# Patient Record
Sex: Male | Born: 1955 | Race: White | Hispanic: No | Smoking: Never smoker
Health system: Southern US, Community
[De-identification: ages and names within clinical notes are randomized; demographics above are authoritative.]

## PROBLEM LIST (undated history)

## (undated) DIAGNOSIS — E559 Vitamin D deficiency, unspecified: Secondary | ICD-10-CM

## (undated) DIAGNOSIS — K760 Fatty (change of) liver, not elsewhere classified: Secondary | ICD-10-CM

## (undated) DIAGNOSIS — D1720 Benign lipomatous neoplasm of skin and subcutaneous tissue of unspecified limb: Secondary | ICD-10-CM

## (undated) DIAGNOSIS — C4491 Basal cell carcinoma of skin, unspecified: Secondary | ICD-10-CM

## (undated) DIAGNOSIS — E538 Deficiency of other specified B group vitamins: Secondary | ICD-10-CM

## (undated) DIAGNOSIS — R972 Elevated prostate specific antigen [PSA]: Secondary | ICD-10-CM

## (undated) DIAGNOSIS — R011 Cardiac murmur, unspecified: Secondary | ICD-10-CM

## (undated) DIAGNOSIS — R195 Other fecal abnormalities: Secondary | ICD-10-CM

## (undated) DIAGNOSIS — D239 Other benign neoplasm of skin, unspecified: Secondary | ICD-10-CM

## (undated) DIAGNOSIS — M1990 Unspecified osteoarthritis, unspecified site: Secondary | ICD-10-CM

## (undated) DIAGNOSIS — R7989 Other specified abnormal findings of blood chemistry: Secondary | ICD-10-CM

## (undated) DIAGNOSIS — I340 Nonrheumatic mitral (valve) insufficiency: Secondary | ICD-10-CM

## (undated) DIAGNOSIS — K921 Melena: Secondary | ICD-10-CM

## (undated) DIAGNOSIS — J309 Allergic rhinitis, unspecified: Secondary | ICD-10-CM

## (undated) HISTORY — DX: Fatty (change of) liver, not elsewhere classified: K76.0

## (undated) HISTORY — DX: Melena: K92.1

## (undated) HISTORY — DX: Vitamin D deficiency, unspecified: E55.9

## (undated) HISTORY — DX: Elevated prostate specific antigen (PSA): R97.20

## (undated) HISTORY — DX: Deficiency of other specified B group vitamins: E53.8

## (undated) HISTORY — DX: Other specified abnormal findings of blood chemistry: R79.89

## (undated) HISTORY — DX: Nonrheumatic mitral (valve) insufficiency: I34.0

## (undated) HISTORY — DX: Basal cell carcinoma of skin, unspecified: C44.91

## (undated) HISTORY — DX: Benign lipomatous neoplasm of skin and subcutaneous tissue of unspecified limb: D17.20

## (undated) HISTORY — DX: Other fecal abnormalities: R19.5

## (undated) HISTORY — DX: Unspecified osteoarthritis, unspecified site: M19.90

## (undated) HISTORY — DX: Allergic rhinitis, unspecified: J30.9

## (undated) HISTORY — DX: Cardiac murmur, unspecified: R01.1

## (undated) HISTORY — DX: Other benign neoplasm of skin, unspecified: D23.9

---

## 2003-08-03 ENCOUNTER — Ambulatory Visit (HOSPITAL_BASED_OUTPATIENT_CLINIC_OR_DEPARTMENT_OTHER): Admission: RE | Admit: 2003-08-03 | Discharge: 2003-08-03 | Payer: Self-pay | Admitting: Plastic Surgery

## 2003-08-03 ENCOUNTER — Encounter (INDEPENDENT_AMBULATORY_CARE_PROVIDER_SITE_OTHER): Payer: Self-pay | Admitting: Specialist

## 2008-12-28 ENCOUNTER — Encounter: Admission: RE | Admit: 2008-12-28 | Discharge: 2008-12-28 | Payer: Self-pay | Admitting: Internal Medicine

## 2009-01-04 ENCOUNTER — Encounter: Admission: RE | Admit: 2009-01-04 | Discharge: 2009-01-04 | Payer: Self-pay | Admitting: Internal Medicine

## 2009-09-16 ENCOUNTER — Encounter: Admission: RE | Admit: 2009-09-16 | Discharge: 2009-09-16 | Payer: Self-pay | Admitting: Gastroenterology

## 2011-03-30 NOTE — Op Note (Signed)
NAME:  Larry Stephenson, Larry Stephenson                        ACCOUNT NO.:  [id number]   MEDICAL RECORD NO.:  [id number]                   PATIENT TYPE:  AMB   LOCATION:  DSC                                  FACILITY:  [facility]   PHYSICIAN:  Alfredia Ferguson, M.D.               DATE OF BIRTH:  Mar 23, 1956   DATE OF PROCEDURE:  08/03/2003  DATE OF DISCHARGE:                                 OPERATIVE REPORT   PREOPERATIVE DIAGNOSES:  1. Lipoma (5 cm) left upper abdomen just over left costal margins.  2. Lipoma (3 cm) left mid back over paraspinous muscles.   POSTOPERATIVE DIAGNOSES:  1. Lipoma (5 cm) left upper abdomen just over left costal margins.  2. Lipoma (3 cm) left mid back over paraspinous muscles.   PROCEDURE:  1. Excision of left intramuscular lipoma left upper abdomen (contained with     left external oblique muscle).  2. Excision left paraspinous lipoma.   SURGEON:  Alfredia Ferguson, M.D.   ANESTHESIA:  2% Xylocaine 1:100,000 epinephrine.   INDICATIONS:  This is a 55 year old male with a slowly enlarging lipoma both  on his left upper abdomen over his left costal margins and the left  paraspinous area in the mid back.  The patient wishes to have these removed  because of the recent growth pattern.  He understands he will be trading  what he has for a permanent potentially unsightly scar.  He understands the  risks of recurrence.   DESCRIPTION OF PROCEDURE:  Skin marks were placed over the lesion over the  left costal margin, and local anesthesia was infiltrated.  The patient was  then rotated into a prone position and local anesthesia was infiltrated over  the lipoma left paraspinous area.  The back was prepared first with Betadine  and then prepped with sterile drapes.  After awaiting approximately 10  minutes after injection, a transversely oriented 3 cm incision was made over  the lipoma.  The incision was deepened until reaching a dense fibrous  covering over the lipoma.  It was  fairly deep.  The fibrous covering was  opened, and the lipoma was visualized.  Using sharp and blunt dissection,  the lipoma was dissected out of its anatomic bed.  The lipoma laid over the  paraspinous muscles.  The lipoma was excised in toto.  Hemostasis was  accomplished throughout the dissection using electrocautery.  The wound was  closed, reapproximating the dermis using multiple interrupted 4-0 Monocryl  sutures.  A running 3-0 Prolene subcuticular was then placed.  Dressing was  placed, and the patient was rolled into a supine position.  The left upper  abdomen and lower chest was prepped with Betadine and draped with sterile  drapes.  A 5 cm incision was made over the lipoma and it was deepened until  reaching the fascia of the external oblique muscle.  The lipoma was clearly  contained with  in the fibers of the muscle.  The fascia was opened, and the  lipoma was visualized.  Using blunt dissection, the lipoma was dissected out  of the muscle belly.  Hemostasis was accomplished using electrocautery.  The  lipoma was removed and passed off for pathology.  The fascia over the  external oblique muscle  was closed using interrupted 4-0 Monocryl suture.  The dermis was closed  with a similar suture.  Steri-Strips were applied to the skin.  Light  dressing was applied and the patient was discharged to home in satisfactory  condition.  Estimated blood loss for the procedure was less than 10 mL.                                               Alfredia Ferguson, M.D.    WBB/MEDQ  D:  08/03/2003  T:  08/04/2003  Job:  [id number]   cc:   Bertram Millard. Dahlstedt, M.D.  [address]  [place]  [place] [address]  Fax: [phone]

## 2011-06-26 IMAGING — CT CT ABDOMEN WO/W CM
3 of 8 series · 16 of 36 positions shown, 19 images · IV contrast (agent unspecified)
Comparison: .  CT abdomen of 01/04/2009

CLINICAL DATA: Renal lesion, follow-up

CT ABDOMEN WITHOUT AND WITH CONTRAST
TECHNIQUE: Multidetector CT imaging of the abdomen was performed
following the standard protocol before and during bolus
administration of intravenous contrast.
Contrast: 100 ml

[Series 5: abd w/cm · axial · 0.70mm/px · z∈[-268,-178]mm · 2 of 51 slices shown, 5 images]
[im 17/51  soft-tissue]
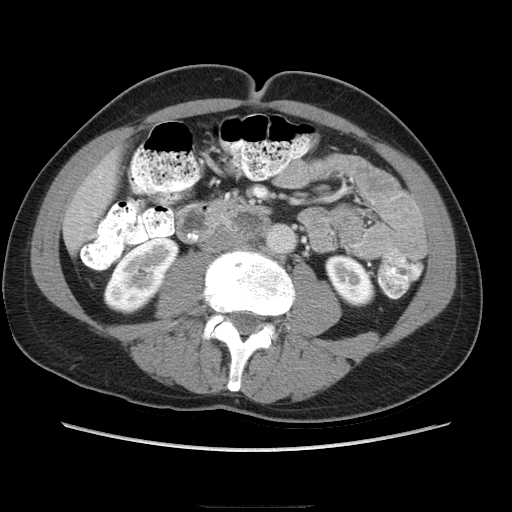
[im 17/51  lung]
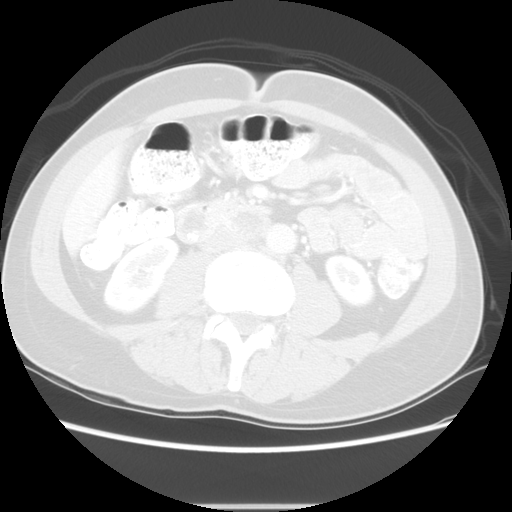
[im 17/51  bone]
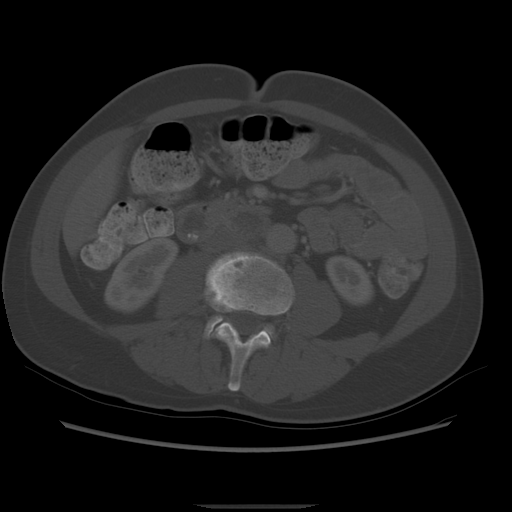
[im 34/51  soft-tissue]
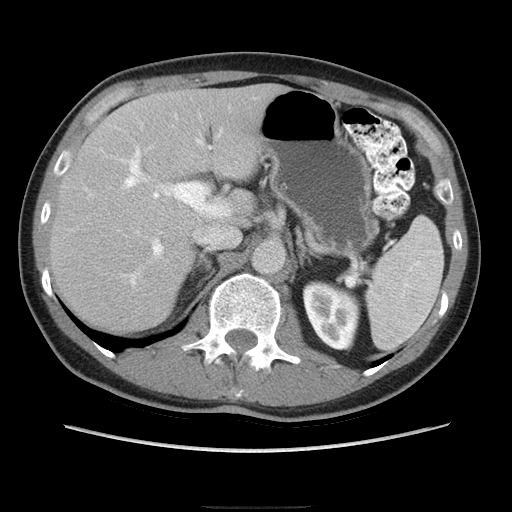
[im 34/51  lung]
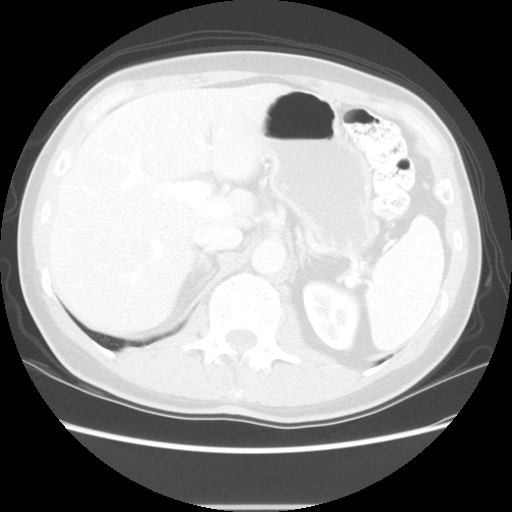

[Series 602: sagittal body · sagittal · 0.73mm/px · 8 of 139 slices shown (1 of 2)]
[im 16/139  soft-tissue]
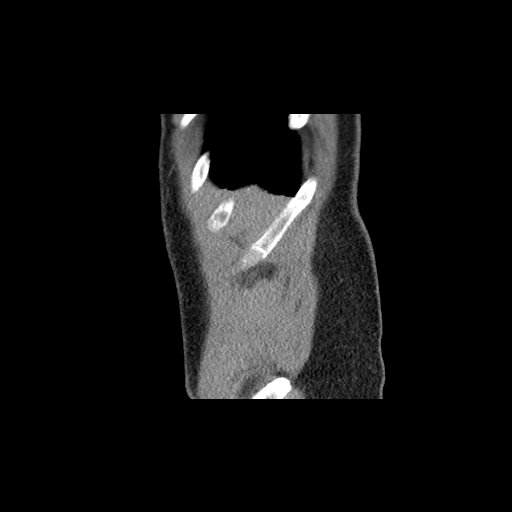
[im 31/139  soft-tissue]
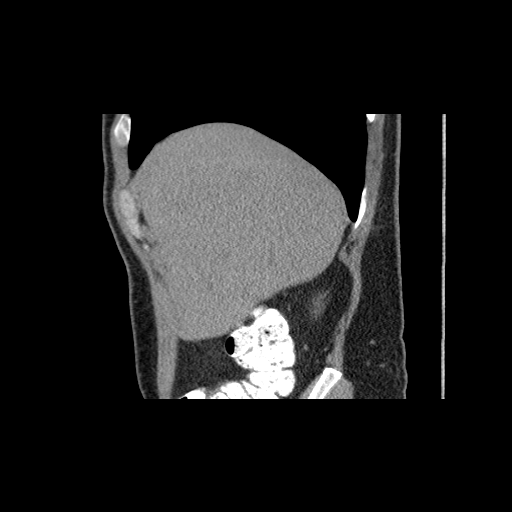
[im 47/139  soft-tissue]
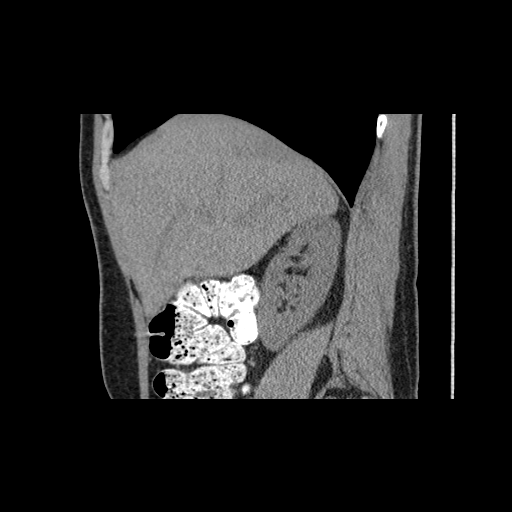
[im 62/139  soft-tissue]
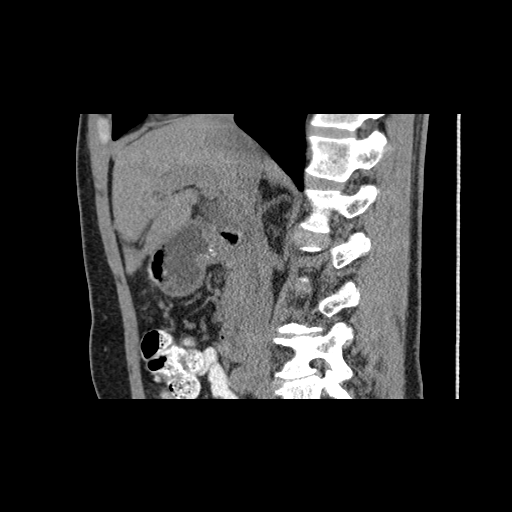
[im 77/139  soft-tissue]
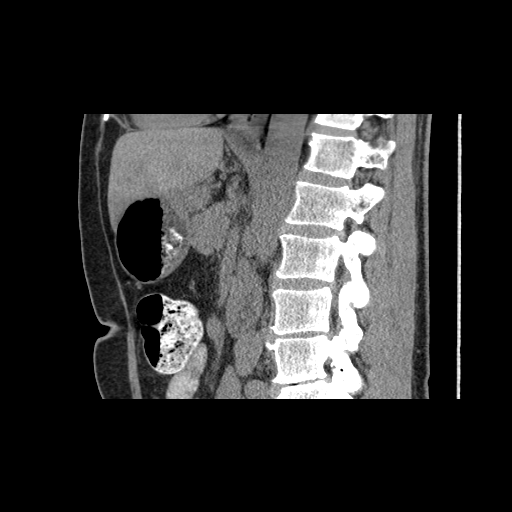
[im 93/139  soft-tissue]
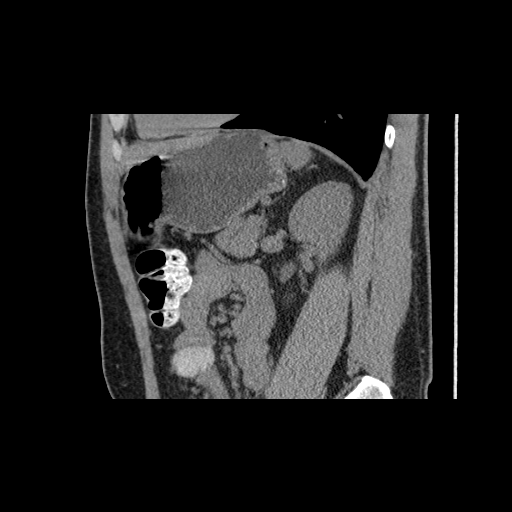
[im 108/139  soft-tissue]
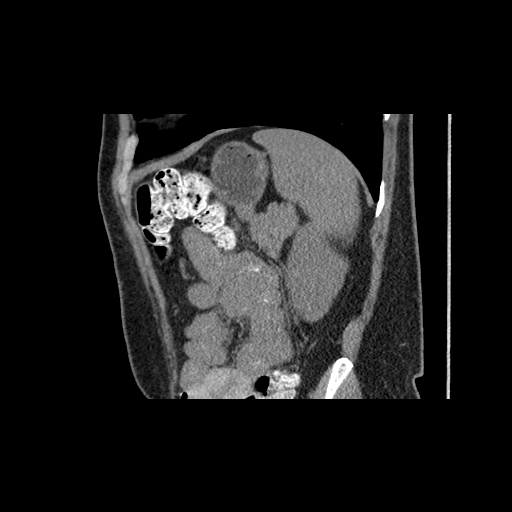
[im 123/139  soft-tissue]
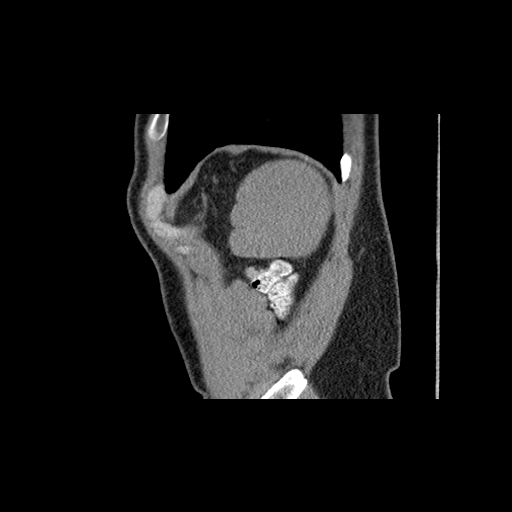

[Series 605: sagittal body · sagittal · 0.70mm/px · 6 of 140 slices shown (2 of 2)]
[im 14/140  soft-tissue]
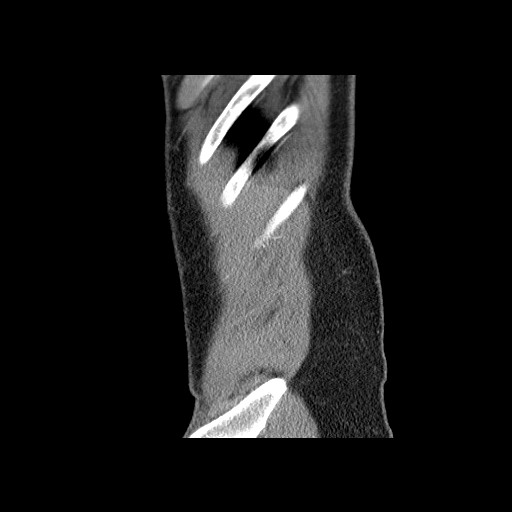
[im 28/140  soft-tissue]
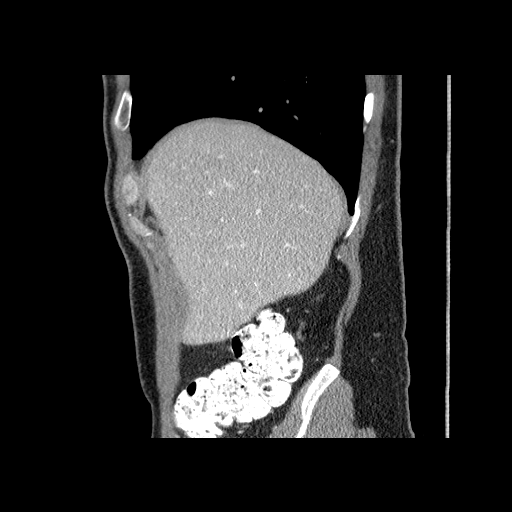
[im 42/140  soft-tissue]
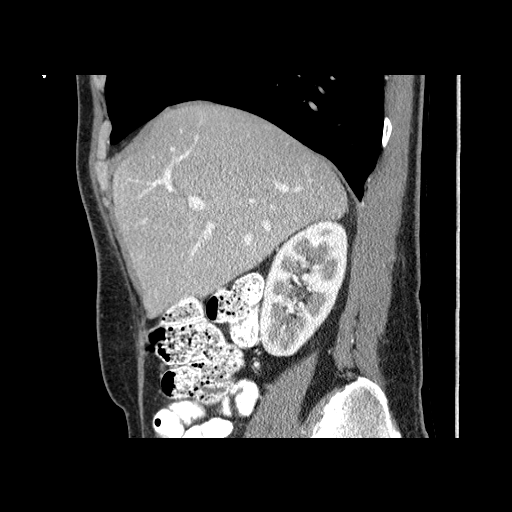
[im 56/140  soft-tissue]
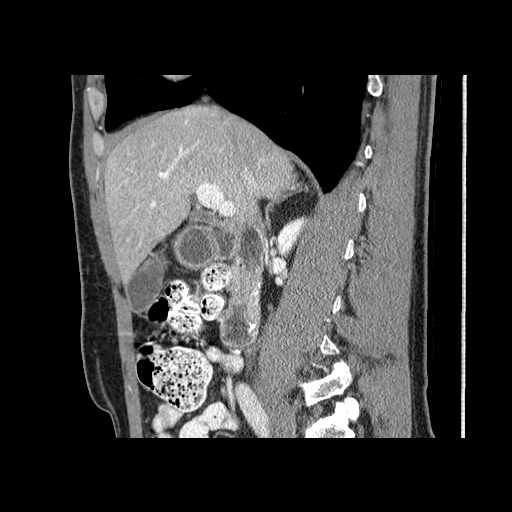
[im 84/140  soft-tissue]
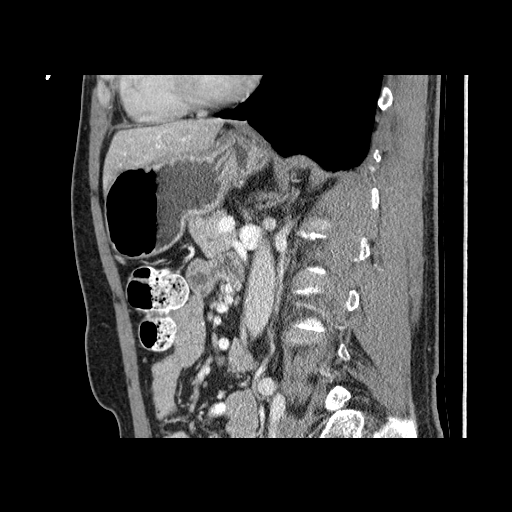
[im 98/140  soft-tissue]
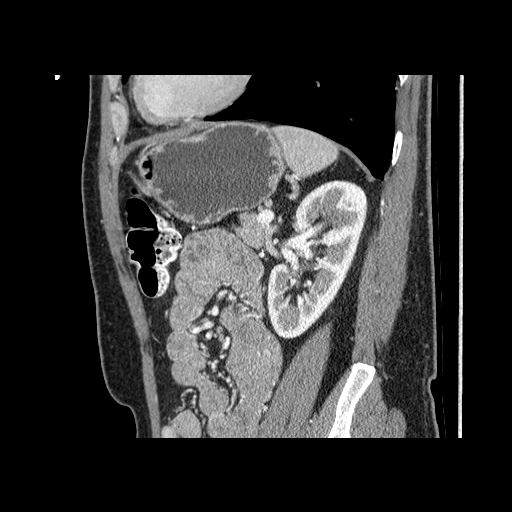

[16 of 36 positions shown; findings below may reference images not displayed]

FINDINGS: The lung bases are clear.  On the unenhanced images only
faint calcification is noted near the low attenuation structure in
the upper pole of the left kidney posteriorly.  No definite renal
calculi are seen.  No calcified gallstones are noted.

After contrast administration, the low attenuation structure in the
upper pole of the left kidney posteriorly is stable.  The kidneys
enhance with no other suspicious abnormality.  On delayed images
this well defined 9 x 11 mm low attenuation structure is completely
stable and is most consistent with a minimally complex left upper
pole renal cyst.  No other renal lesion is seen.  The pelvocaliceal
systems appear normal.

The liver enhances with no focal abnormality.  A probable
hemangioma is noted in the medial right lobe of liver inferiorly
consistent with the ultrasound demonstrated hemangioma.  The
pancreas is normal in size and the pancreatic duct is not dilated.
The adrenal glands and spleen are stable.  The stomach is
moderately well distended and is unremarkable.  The abdominal aorta
is normal in caliber.  No adenopathy is seen.
IMPRESSION: Stable 9 x 11 mm probable complex cyst in the upper pole of the
left kidney posteriorly.  No suspicious renal lesion is seen.

## 2012-01-21 ENCOUNTER — Other Ambulatory Visit: Payer: Self-pay | Admitting: Dermatology

## 2012-08-11 ENCOUNTER — Other Ambulatory Visit: Payer: Self-pay | Admitting: Dermatology

## 2013-03-11 ENCOUNTER — Other Ambulatory Visit: Payer: Self-pay | Admitting: Dermatology

## 2013-04-01 ENCOUNTER — Other Ambulatory Visit: Payer: Self-pay | Admitting: Dermatology

## 2013-09-07 ENCOUNTER — Other Ambulatory Visit: Payer: Self-pay | Admitting: Dermatology

## 2014-03-23 ENCOUNTER — Other Ambulatory Visit: Payer: Self-pay | Admitting: Dermatology

## 2014-09-21 ENCOUNTER — Other Ambulatory Visit: Payer: Self-pay | Admitting: Dermatology

## 2014-11-19 ENCOUNTER — Ambulatory Visit (INDEPENDENT_AMBULATORY_CARE_PROVIDER_SITE_OTHER): Payer: BLUE CROSS/BLUE SHIELD | Admitting: Internal Medicine

## 2014-11-19 DIAGNOSIS — Z23 Encounter for immunization: Secondary | ICD-10-CM | POA: Diagnosis not present

## 2014-11-19 DIAGNOSIS — Z9189 Other specified personal risk factors, not elsewhere classified: Secondary | ICD-10-CM

## 2014-11-19 DIAGNOSIS — G4725 Circadian rhythm sleep disorder, jet lag type: Secondary | ICD-10-CM

## 2014-11-19 MED ORDER — ZOLPIDEM TARTRATE 10 MG PO TABS: 10.0000 mg | ORAL_TABLET | Freq: Every evening | ORAL | Status: DC | PRN

## 2014-11-19 MED ORDER — ATOVAQUONE-PROGUANIL HCL 250-100 MG PO TABS: 1.0000 | ORAL_TABLET | Freq: Every day | ORAL | Status: DC

## 2014-11-19 MED ORDER — CIPROFLOXACIN HCL 500 MG PO TABS: 500.0000 mg | ORAL_TABLET | Freq: Two times a day (BID) | ORAL | Status: DC

## 2014-11-19 NOTE — Progress Notes (Signed)
  Rfv: pre travel counseling to going on trip for 13 days to [place], [place] Subjective:    Patient ID: Larry Stephenson, male    DOB: 09/13/1956, 59 y.o.   MRN: [id number]  HPI Larry Stephenson is a 59yo M in good state of health who will be visiting a friend in [place], [place], as well as going to [place]. He is leaving January 29th.  Previously traveled to [place]  Received flu vaccine this year  No Known Allergies     Review of Systems     Objective:   Physical Exam        Assessment & Plan:  Pre travel counseling plus gave hepatitis A vaccination and typhoid injection  Malaria proph = gave rx for malarone and mosquito bite prevention  Traveler's diarrhea = gave rx for cipro and instructions how to use if needed  Jet lag = gave rx for ambien #6 if needed for jet lag

## 2016-03-27 DIAGNOSIS — D2261 Melanocytic nevi of right upper limb, including shoulder: Secondary | ICD-10-CM | POA: Diagnosis not present

## 2016-03-27 DIAGNOSIS — Z85828 Personal history of other malignant neoplasm of skin: Secondary | ICD-10-CM | POA: Diagnosis not present

## 2016-03-27 DIAGNOSIS — D225 Melanocytic nevi of trunk: Secondary | ICD-10-CM | POA: Diagnosis not present

## 2016-03-27 DIAGNOSIS — D2262 Melanocytic nevi of left upper limb, including shoulder: Secondary | ICD-10-CM | POA: Diagnosis not present

## 2016-03-27 DIAGNOSIS — C44612 Basal cell carcinoma of skin of right upper limb, including shoulder: Secondary | ICD-10-CM | POA: Diagnosis not present

## 2016-03-27 DIAGNOSIS — C44519 Basal cell carcinoma of skin of other part of trunk: Secondary | ICD-10-CM | POA: Diagnosis not present

## 2016-03-27 DIAGNOSIS — L570 Actinic keratosis: Secondary | ICD-10-CM | POA: Diagnosis not present

## 2016-09-25 DIAGNOSIS — L821 Other seborrheic keratosis: Secondary | ICD-10-CM | POA: Diagnosis not present

## 2016-09-25 DIAGNOSIS — D2261 Melanocytic nevi of right upper limb, including shoulder: Secondary | ICD-10-CM | POA: Diagnosis not present

## 2016-09-25 DIAGNOSIS — D225 Melanocytic nevi of trunk: Secondary | ICD-10-CM | POA: Diagnosis not present

## 2016-09-25 DIAGNOSIS — Z85828 Personal history of other malignant neoplasm of skin: Secondary | ICD-10-CM | POA: Diagnosis not present

## 2016-10-09 DIAGNOSIS — J0110 Acute frontal sinusitis, unspecified: Secondary | ICD-10-CM | POA: Diagnosis not present

## 2016-12-17 DIAGNOSIS — Z0000 Encounter for general adult medical examination without abnormal findings: Secondary | ICD-10-CM | POA: Diagnosis not present

## 2016-12-17 DIAGNOSIS — K760 Fatty (change of) liver, not elsewhere classified: Secondary | ICD-10-CM | POA: Diagnosis not present

## 2016-12-17 DIAGNOSIS — Z125 Encounter for screening for malignant neoplasm of prostate: Secondary | ICD-10-CM | POA: Diagnosis not present

## 2016-12-20 DIAGNOSIS — H524 Presbyopia: Secondary | ICD-10-CM | POA: Diagnosis not present

## 2016-12-20 DIAGNOSIS — H02403 Unspecified ptosis of bilateral eyelids: Secondary | ICD-10-CM | POA: Diagnosis not present

## 2016-12-31 DIAGNOSIS — Z1211 Encounter for screening for malignant neoplasm of colon: Secondary | ICD-10-CM | POA: Diagnosis not present

## 2016-12-31 DIAGNOSIS — Z1212 Encounter for screening for malignant neoplasm of rectum: Secondary | ICD-10-CM | POA: Diagnosis not present

## 2017-02-14 ENCOUNTER — Other Ambulatory Visit: Payer: Self-pay | Admitting: Gastroenterology

## 2017-02-14 DIAGNOSIS — H01001 Unspecified blepharitis right upper eyelid: Secondary | ICD-10-CM | POA: Diagnosis not present

## 2017-02-14 DIAGNOSIS — H02403 Unspecified ptosis of bilateral eyelids: Secondary | ICD-10-CM | POA: Diagnosis not present

## 2017-02-14 DIAGNOSIS — H01002 Unspecified blepharitis right lower eyelid: Secondary | ICD-10-CM | POA: Diagnosis not present

## 2017-02-14 DIAGNOSIS — H01004 Unspecified blepharitis left upper eyelid: Secondary | ICD-10-CM | POA: Diagnosis not present

## 2017-03-01 DIAGNOSIS — B351 Tinea unguium: Secondary | ICD-10-CM | POA: Diagnosis not present

## 2017-03-01 DIAGNOSIS — L609 Nail disorder, unspecified: Secondary | ICD-10-CM | POA: Diagnosis not present

## 2017-03-11 ENCOUNTER — Encounter: Payer: Self-pay | Admitting: Podiatry

## 2017-03-11 ENCOUNTER — Ambulatory Visit (INDEPENDENT_AMBULATORY_CARE_PROVIDER_SITE_OTHER): Payer: BLUE CROSS/BLUE SHIELD | Admitting: Podiatry

## 2017-03-11 VITALS — BP 146/102 | HR 49

## 2017-03-11 DIAGNOSIS — B351 Tinea unguium: Secondary | ICD-10-CM | POA: Diagnosis not present

## 2017-03-11 DIAGNOSIS — L603 Nail dystrophy: Secondary | ICD-10-CM

## 2017-03-11 NOTE — Patient Instructions (Signed)

## 2017-03-11 NOTE — Progress Notes (Signed)
   Subjective:    Patient ID: Larry Stephenson, male    DOB: 16-Jul-1956, 61 y.o.   MRN: [id number]  HPI 61 year old male presents the also concerns of his right foot second toenail becoming thick and discolored which is been worsening over the last 3-4 months. He denies any pain and denies any redness or drainage. Has had no recent treatment for this. Denies any claudication symptoms. No numbness or tingling. No other complaints.   Review of Systems  All other systems reviewed and are negative.      Objective:   Physical Exam General: AAO x3, NAD  Dermatological: Right second digit toenail is dystrophic, mildly hypertrophic with yellow to brown discoloration. There is no pain to the toenail isany redness or drainage. Is no open lesions identified.  Vascular: DP/PT pulses 2/4, CRT < 3 sec. There is no pain with calf compression, swelling, warmth, erythema.   Neruologic: Grossly intact via light touch bilateral. Vibratory intact via tuning fork bilateral. Protective threshold with Semmes Wienstein monofilament intact to all pedal sites bilateral.   Musculoskeletal: Hammertoes present. No pain, crepitus, or limitation noted with foot and ankle range of motion bilateral. Muscular strength 5/5 in all groups tested bilateral.  Gait: Unassisted, Nonantalgic.     Assessment & Plan:   61 year old male right second digit onychodystrophy  -Treatment options discussed including all alternatives, risks, and complications -Etiology of symptoms were discussed -Discussed treatment options but today I did debride the toenail this was sent for pathology/culture and this was sent to  [facility] for evaluation of onychomycosis. Pending results we'll start treatment.   Celesta Gentile, DPM

## 2017-03-12 NOTE — Addendum Note (Signed)
Addended by: Cranford Mon R on: 03/12/2017 04:11 PM   Modules accepted: Orders

## 2017-03-22 ENCOUNTER — Encounter (HOSPITAL_COMMUNITY): Payer: Self-pay

## 2017-03-26 ENCOUNTER — Encounter (HOSPITAL_COMMUNITY)
Admission: RE | Disposition: A | Discharge status: Home / Self Care | Payer: Self-pay | Source: Ambulatory Visit | Attending: Gastroenterology

## 2017-03-26 ENCOUNTER — Encounter (HOSPITAL_COMMUNITY): Payer: Self-pay | Admitting: Anesthesiology

## 2017-03-26 ENCOUNTER — Ambulatory Visit (HOSPITAL_COMMUNITY): Payer: BLUE CROSS/BLUE SHIELD | Admitting: Anesthesiology

## 2017-03-26 ENCOUNTER — Ambulatory Visit (HOSPITAL_COMMUNITY)
Admission: RE | Admit: 2017-03-26 | Discharge: 2017-03-26 | Disposition: A | Discharge status: Home / Self Care | Payer: BLUE CROSS/BLUE SHIELD | Source: Ambulatory Visit | Attending: Gastroenterology | Admitting: Gastroenterology

## 2017-03-26 DIAGNOSIS — Z1211 Encounter for screening for malignant neoplasm of colon: Secondary | ICD-10-CM | POA: Diagnosis not present

## 2017-03-26 HISTORY — PX: COLONOSCOPY WITH PROPOFOL: SHX5780

## 2017-03-26 SURGERY — COLONOSCOPY WITH PROPOFOL
Anesthesia: Monitor Anesthesia Care

## 2017-03-26 MED ORDER — PROPOFOL 10 MG/ML IV BOLUS
INTRAVENOUS | Status: AC
  Filled 2017-03-26: qty 40

## 2017-03-26 MED ORDER — SODIUM CHLORIDE 0.9 % IV SOLN: INTRAVENOUS | Status: DC

## 2017-03-26 MED ORDER — GLYCOPYRROLATE 0.2 MG/ML IJ SOLN
INTRAMUSCULAR | Status: DC | PRN
  Administered 2017-03-26: 0.2 mg via INTRAVENOUS

## 2017-03-26 MED ORDER — PROPOFOL 500 MG/50ML IV EMUL
INTRAVENOUS | Status: DC | PRN
  Administered 2017-03-26: 150 ug/kg/min via INTRAVENOUS

## 2017-03-26 MED ORDER — MIDAZOLAM HCL 2 MG/2ML IJ SOLN
INTRAMUSCULAR | Status: AC
  Filled 2017-03-26: qty 2

## 2017-03-26 MED ORDER — MIDAZOLAM HCL 5 MG/5ML IJ SOLN
INTRAMUSCULAR | Status: DC | PRN
  Administered 2017-03-26: 2 mg via INTRAVENOUS

## 2017-03-26 MED ORDER — PHENYLEPHRINE 40 MCG/ML (10ML) SYRINGE FOR IV PUSH (FOR BLOOD PRESSURE SUPPORT)
PREFILLED_SYRINGE | INTRAVENOUS | Status: AC
  Filled 2017-03-26: qty 10

## 2017-03-26 MED ORDER — LACTATED RINGERS IV SOLN
INTRAVENOUS | Status: DC
  Administered 2017-03-26 (×2): via INTRAVENOUS

## 2017-03-26 MED ORDER — PROPOFOL 500 MG/50ML IV EMUL
INTRAVENOUS | Status: DC | PRN
  Administered 2017-03-26: 50 mg via INTRAVENOUS

## 2017-03-26 SURGICAL SUPPLY — 21 items

## 2017-03-26 NOTE — Anesthesia Preprocedure Evaluation (Signed)
Anesthesia Evaluation  Patient identified by MRN, date of birth, ID band Patient awake    Reviewed: Allergy & Precautions, NPO status , Patient's Chart, lab work & pertinent test results  History of Anesthesia Complications Negative for: history of anesthetic complications  Airway Mallampati: II  TM Distance: >3 FB Neck ROM: Full    Dental no notable dental hx. (+) Dental Advisory Given   Pulmonary neg pulmonary ROS,    Pulmonary exam normal        Cardiovascular negative cardio ROS Normal cardiovascular exam     Neuro/Psych negative neurological ROS  negative psych ROS   GI/Hepatic negative GI ROS, Neg liver ROS,   Endo/Other  negative endocrine ROS  Renal/GU negative Renal ROS  negative genitourinary   Musculoskeletal negative musculoskeletal ROS (+)   Abdominal   Peds negative pediatric ROS (+)  Hematology negative hematology ROS (+)   Anesthesia Other Findings   Reproductive/Obstetrics negative OB ROS                             Anesthesia Physical Anesthesia Plan  ASA: II  Anesthesia Plan: MAC   Post-op Pain Management:    Induction:   Airway Management Planned: Natural Airway  Additional Equipment:   Intra-op Plan:   Post-operative Plan:   Informed Consent: I have reviewed the patients History and Physical, chart, labs and discussed the procedure including the risks, benefits and alternatives for the proposed anesthesia with the patient or authorized representative who has indicated his/her understanding and acceptance.   Dental advisory given  Plan Discussed with: CRNA and Anesthesiologist  Anesthesia Plan Comments:         Anesthesia Quick Evaluation

## 2017-03-26 NOTE — H&P (Signed)
Procedure: Screening colonoscopy to evaluate positive Cologuard stool colon cancer screening test  History: The patient is a 61 year old male born [birth year]. He underwent a normal baseline screening colonoscopy on 09/26/2007. He is scheduled to undergo a repeat screening colonoscopy today.  Past medical history: Bilateral ear surgeries as a child. Allergic rhinitis. Fatty appearing liver.  Exam: The patient is alert and lying comfortably on the endoscopy stretcher. Abdomen is soft and nontender to palpation. Lungs are clear to auscultation. Cardiac exam reveals a regular rhythm.  Plan: Proceed with screening colonoscopy

## 2017-03-26 NOTE — Discharge Instructions (Signed)

## 2017-03-26 NOTE — Op Note (Signed)
[facility] Patient Name: Larry Stephenson Procedure Date: 03/26/2017 MRN: [id number] Attending MD: Garlan Fair , MD Date of Birth: [birth year] CSN: [id number] Age: 61 Admit Type: Outpatient Procedure:                Colonoscopy Indications:              Screening for colorectal malignant neoplasm. Normal                            screening colonoscopy was performed on 09/26/2007 Providers:                Garlan Fair, MD, Zenon Mayo, RN, Corliss Parish, Technician Referring MD:              Medicines:                Propofol per Anesthesia Complications:            No immediate complications. Estimated Blood Loss:     Estimated blood loss: none. Procedure:                Pre-Anesthesia Assessment:                           - Prior to the procedure, a History and Physical                            was performed, and patient medications and                            allergies were reviewed. The patient's tolerance of                            previous anesthesia was also reviewed. The risks                            and benefits of the procedure and the sedation                            options and risks were discussed with the patient.                            All questions were answered, and informed consent                            was obtained. Prior Anticoagulants: The patient has                            taken no previous anticoagulant or antiplatelet                            agents. ASA Grade Assessment: II - A patient with  mild systemic disease. After reviewing the risks                            and benefits, the patient was deemed in                            satisfactory condition to undergo the procedure.                           After obtaining informed consent, the colonoscope                            was passed under direct vision. Throughout the    procedure, the patient's blood pressure, pulse, and                            oxygen saturations were monitored continuously. The                            EC-[id number] ([id number]) scope was introduced through                            the anus and advanced to the the cecum, identified                            by appendiceal orifice and ileocecal valve. The                            colonoscopy was performed without difficulty. The                            patient tolerated the procedure well. The quality                            of the bowel preparation was good. The terminal                            ileum, the ileocecal valve, the appendiceal orifice                            and the rectum were photographed. Scope In: 1:53:44 PM Scope Out: 2:09:38 PM Scope Withdrawal Time: 0 hours 8 minutes 29 seconds  Total Procedure Duration: 0 hours 15 minutes 54 seconds  Findings:      The perianal and digital rectal examinations were normal.      The entire examined colon appeared normal. Impression:               - The entire examined colon is normal.                           - No specimens collected. Moderate Sedation:      N/A- Per Anesthesia Care Recommendation:           - Patient has a contact number available for  emergencies. The signs and symptoms of potential                            delayed complications were discussed with the                            patient. Return to normal activities tomorrow.                            Written discharge instructions were provided to the                            patient.                           - Repeat colonoscopy in 10 years for screening                            purposes.                           - Resume previous diet.                           - Continue present medications. Procedure Code(s):        --- Professional ---                           G2836, Colorectal cancer screening; colonoscopy  on                            individual not meeting criteria for high risk Diagnosis Code(s):        --- Professional ---                           Z12.11, Encounter for screening for malignant                            neoplasm of colon CPT copyright 2016 [employer]. All rights reserved. The codes documented in this report are preliminary and upon coder review may  be revised to meet current compliance requirements. Earle Gell, MD Garlan Fair, MD 03/26/2017 2:14:32 PM This report has been signed electronically. Number of Addenda: 0

## 2017-03-26 NOTE — Transfer of Care (Signed)
Immediate Anesthesia Transfer of Care Note  Patient: Larry Stephenson  Procedure(s) Performed: Procedure(s) with comments: COLONOSCOPY WITH PROPOFOL (N/A) - [phone]  Patient Location: PACU  Anesthesia Type:MAC  Level of Consciousness:  sedated, patient cooperative and responds to stimulation  Airway & Oxygen Therapy:Patient Spontanous Breathing and Patient connected to face mask oxgen  Post-op Assessment:  Report given to PACU RN and Post -op Vital signs reviewed and stable  Post vital signs:  Reviewed and stable  Last Vitals:  Vitals:   03/26/17 1237  BP: (!) 135/100  Pulse: (!) 50  Resp: 16  Temp: 04.5 C    Complications: No apparent anesthesia complications

## 2017-03-26 NOTE — Anesthesia Postprocedure Evaluation (Addendum)
Anesthesia Post Note  Patient: Larry Stephenson  Procedure(s) Performed: Procedure(s) (LRB): COLONOSCOPY WITH PROPOFOL (N/A)  Patient location during evaluation: Endoscopy Anesthesia Type: MAC Level of consciousness: awake and alert Pain management: pain level controlled Vital Signs Assessment: post-procedure vital signs reviewed and stable Respiratory status: spontaneous breathing and respiratory function stable Cardiovascular status: stable Anesthetic complications: no       Last Vitals:  Vitals:   03/26/17 1237 03/26/17 1418  BP: (!) 135/100 111/72  Pulse: (!) 50 61  Resp: 16 17  Temp: 36.5 C 36.4 C    Last Pain:  Vitals:   03/26/17 1418  TempSrc: Oral                 Curry Dulski DANIEL

## 2017-03-27 ENCOUNTER — Encounter (HOSPITAL_COMMUNITY): Payer: Self-pay | Admitting: Gastroenterology

## 2017-04-01 ENCOUNTER — Telehealth: Payer: Self-pay | Admitting: *Deleted

## 2017-04-01 NOTE — Telephone Encounter (Addendum)
Dr. Jacqualyn Posey states 03/01/2017 fungal culture is negative, due to trauma, may recommend Urea, but can still use the [employer] topical fungus. Left message to call for results.04/10/2017-Pt called for fungal culture results. Left message informing pt of Dr. Leigh Aurora review of results and recommendation.

## 2017-04-13 NOTE — Addendum Note (Signed)
Addendum  created 04/13/17 1010 by Duane Boston, MD   Sign clinical note

## 2017-06-13 DIAGNOSIS — H02832 Dermatochalasis of right lower eyelid: Secondary | ICD-10-CM | POA: Diagnosis not present

## 2017-06-13 DIAGNOSIS — H02834 Dermatochalasis of left upper eyelid: Secondary | ICD-10-CM | POA: Diagnosis not present

## 2017-06-13 DIAGNOSIS — H02423 Myogenic ptosis of bilateral eyelids: Secondary | ICD-10-CM | POA: Diagnosis not present

## 2017-06-13 DIAGNOSIS — H02831 Dermatochalasis of right upper eyelid: Secondary | ICD-10-CM | POA: Diagnosis not present

## 2017-07-11 DIAGNOSIS — D485 Neoplasm of uncertain behavior of skin: Secondary | ICD-10-CM | POA: Diagnosis not present

## 2017-07-11 DIAGNOSIS — L739 Follicular disorder, unspecified: Secondary | ICD-10-CM | POA: Diagnosis not present

## 2017-09-10 DIAGNOSIS — Z23 Encounter for immunization: Secondary | ICD-10-CM | POA: Diagnosis not present

## 2017-09-25 DIAGNOSIS — Z85828 Personal history of other malignant neoplasm of skin: Secondary | ICD-10-CM | POA: Diagnosis not present

## 2017-09-25 DIAGNOSIS — C44519 Basal cell carcinoma of skin of other part of trunk: Secondary | ICD-10-CM | POA: Diagnosis not present

## 2017-09-25 DIAGNOSIS — D225 Melanocytic nevi of trunk: Secondary | ICD-10-CM | POA: Diagnosis not present

## 2017-09-25 DIAGNOSIS — D2239 Melanocytic nevi of other parts of face: Secondary | ICD-10-CM | POA: Diagnosis not present

## 2017-09-25 DIAGNOSIS — C44619 Basal cell carcinoma of skin of left upper limb, including shoulder: Secondary | ICD-10-CM | POA: Diagnosis not present

## 2017-09-25 DIAGNOSIS — L718 Other rosacea: Secondary | ICD-10-CM | POA: Diagnosis not present

## 2017-10-16 DIAGNOSIS — H02423 Myogenic ptosis of bilateral eyelids: Secondary | ICD-10-CM | POA: Diagnosis not present

## 2017-12-23 DIAGNOSIS — Z0000 Encounter for general adult medical examination without abnormal findings: Secondary | ICD-10-CM | POA: Diagnosis not present

## 2017-12-23 DIAGNOSIS — Z125 Encounter for screening for malignant neoplasm of prostate: Secondary | ICD-10-CM | POA: Diagnosis not present

## 2017-12-23 DIAGNOSIS — K760 Fatty (change of) liver, not elsewhere classified: Secondary | ICD-10-CM | POA: Diagnosis not present

## 2018-03-31 DIAGNOSIS — Z85828 Personal history of other malignant neoplasm of skin: Secondary | ICD-10-CM | POA: Diagnosis not present

## 2018-03-31 DIAGNOSIS — D2261 Melanocytic nevi of right upper limb, including shoulder: Secondary | ICD-10-CM | POA: Diagnosis not present

## 2018-03-31 DIAGNOSIS — D225 Melanocytic nevi of trunk: Secondary | ICD-10-CM | POA: Diagnosis not present

## 2018-03-31 DIAGNOSIS — C44519 Basal cell carcinoma of skin of other part of trunk: Secondary | ICD-10-CM | POA: Diagnosis not present

## 2018-03-31 DIAGNOSIS — D2262 Melanocytic nevi of left upper limb, including shoulder: Secondary | ICD-10-CM | POA: Diagnosis not present

## 2018-09-22 DIAGNOSIS — L821 Other seborrheic keratosis: Secondary | ICD-10-CM | POA: Diagnosis not present

## 2018-09-22 DIAGNOSIS — Z85828 Personal history of other malignant neoplasm of skin: Secondary | ICD-10-CM | POA: Diagnosis not present

## 2018-09-22 DIAGNOSIS — D485 Neoplasm of uncertain behavior of skin: Secondary | ICD-10-CM | POA: Diagnosis not present

## 2018-09-22 DIAGNOSIS — C44519 Basal cell carcinoma of skin of other part of trunk: Secondary | ICD-10-CM | POA: Diagnosis not present

## 2018-09-22 DIAGNOSIS — D2262 Melanocytic nevi of left upper limb, including shoulder: Secondary | ICD-10-CM | POA: Diagnosis not present

## 2018-09-22 DIAGNOSIS — D2261 Melanocytic nevi of right upper limb, including shoulder: Secondary | ICD-10-CM | POA: Diagnosis not present

## 2018-09-22 DIAGNOSIS — L820 Inflamed seborrheic keratosis: Secondary | ICD-10-CM | POA: Diagnosis not present

## 2018-09-22 DIAGNOSIS — L570 Actinic keratosis: Secondary | ICD-10-CM | POA: Diagnosis not present

## 2018-12-25 DIAGNOSIS — Z125 Encounter for screening for malignant neoplasm of prostate: Secondary | ICD-10-CM | POA: Diagnosis not present

## 2018-12-25 DIAGNOSIS — K760 Fatty (change of) liver, not elsewhere classified: Secondary | ICD-10-CM | POA: Diagnosis not present

## 2018-12-25 DIAGNOSIS — Z0000 Encounter for general adult medical examination without abnormal findings: Secondary | ICD-10-CM | POA: Diagnosis not present

## 2018-12-25 DIAGNOSIS — R7989 Other specified abnormal findings of blood chemistry: Secondary | ICD-10-CM | POA: Diagnosis not present

## 2019-01-26 DIAGNOSIS — R972 Elevated prostate specific antigen [PSA]: Secondary | ICD-10-CM | POA: Diagnosis not present

## 2019-03-30 DIAGNOSIS — C44519 Basal cell carcinoma of skin of other part of trunk: Secondary | ICD-10-CM | POA: Diagnosis not present

## 2019-03-30 DIAGNOSIS — D225 Melanocytic nevi of trunk: Secondary | ICD-10-CM | POA: Diagnosis not present

## 2019-03-30 DIAGNOSIS — D2262 Melanocytic nevi of left upper limb, including shoulder: Secondary | ICD-10-CM | POA: Diagnosis not present

## 2019-03-30 DIAGNOSIS — L821 Other seborrheic keratosis: Secondary | ICD-10-CM | POA: Diagnosis not present

## 2019-03-30 DIAGNOSIS — Z85828 Personal history of other malignant neoplasm of skin: Secondary | ICD-10-CM | POA: Diagnosis not present

## 2019-10-06 DIAGNOSIS — C44519 Basal cell carcinoma of skin of other part of trunk: Secondary | ICD-10-CM | POA: Diagnosis not present

## 2019-10-06 DIAGNOSIS — Z85828 Personal history of other malignant neoplasm of skin: Secondary | ICD-10-CM | POA: Diagnosis not present

## 2019-10-06 DIAGNOSIS — L821 Other seborrheic keratosis: Secondary | ICD-10-CM | POA: Diagnosis not present

## 2019-10-06 DIAGNOSIS — C4441 Basal cell carcinoma of skin of scalp and neck: Secondary | ICD-10-CM | POA: Diagnosis not present

## 2019-10-06 DIAGNOSIS — D2261 Melanocytic nevi of right upper limb, including shoulder: Secondary | ICD-10-CM | POA: Diagnosis not present

## 2019-10-06 DIAGNOSIS — D225 Melanocytic nevi of trunk: Secondary | ICD-10-CM | POA: Diagnosis not present

## 2019-10-06 DIAGNOSIS — D485 Neoplasm of uncertain behavior of skin: Secondary | ICD-10-CM | POA: Diagnosis not present

## 2019-11-10 DIAGNOSIS — C44519 Basal cell carcinoma of skin of other part of trunk: Secondary | ICD-10-CM | POA: Diagnosis not present

## 2020-01-07 DIAGNOSIS — K760 Fatty (change of) liver, not elsewhere classified: Secondary | ICD-10-CM | POA: Diagnosis not present

## 2020-01-07 DIAGNOSIS — Z13220 Encounter for screening for lipoid disorders: Secondary | ICD-10-CM | POA: Diagnosis not present

## 2020-01-07 DIAGNOSIS — M1990 Unspecified osteoarthritis, unspecified site: Secondary | ICD-10-CM | POA: Diagnosis not present

## 2020-01-07 DIAGNOSIS — Z125 Encounter for screening for malignant neoplasm of prostate: Secondary | ICD-10-CM | POA: Diagnosis not present

## 2020-01-07 DIAGNOSIS — E538 Deficiency of other specified B group vitamins: Secondary | ICD-10-CM | POA: Diagnosis not present

## 2020-01-07 DIAGNOSIS — Z0000 Encounter for general adult medical examination without abnormal findings: Secondary | ICD-10-CM | POA: Diagnosis not present

## 2020-01-07 DIAGNOSIS — E559 Vitamin D deficiency, unspecified: Secondary | ICD-10-CM | POA: Diagnosis not present

## 2020-02-10 DIAGNOSIS — E538 Deficiency of other specified B group vitamins: Secondary | ICD-10-CM | POA: Diagnosis not present

## 2020-02-10 DIAGNOSIS — R972 Elevated prostate specific antigen [PSA]: Secondary | ICD-10-CM | POA: Diagnosis not present

## 2020-04-04 DIAGNOSIS — D045 Carcinoma in situ of skin of trunk: Secondary | ICD-10-CM | POA: Diagnosis not present

## 2020-04-04 DIAGNOSIS — C44619 Basal cell carcinoma of skin of left upper limb, including shoulder: Secondary | ICD-10-CM | POA: Diagnosis not present

## 2020-04-04 DIAGNOSIS — D044 Carcinoma in situ of skin of scalp and neck: Secondary | ICD-10-CM | POA: Diagnosis not present

## 2020-10-13 DIAGNOSIS — D2261 Melanocytic nevi of right upper limb, including shoulder: Secondary | ICD-10-CM | POA: Diagnosis not present

## 2020-10-13 DIAGNOSIS — D225 Melanocytic nevi of trunk: Secondary | ICD-10-CM | POA: Diagnosis not present

## 2020-10-13 DIAGNOSIS — D3617 Benign neoplasm of peripheral nerves and autonomic nervous system of trunk, unspecified: Secondary | ICD-10-CM | POA: Diagnosis not present

## 2020-10-13 DIAGNOSIS — D485 Neoplasm of uncertain behavior of skin: Secondary | ICD-10-CM | POA: Diagnosis not present

## 2020-10-13 DIAGNOSIS — Z85828 Personal history of other malignant neoplasm of skin: Secondary | ICD-10-CM | POA: Diagnosis not present

## 2020-10-13 DIAGNOSIS — C44519 Basal cell carcinoma of skin of other part of trunk: Secondary | ICD-10-CM | POA: Diagnosis not present

## 2020-10-13 DIAGNOSIS — L821 Other seborrheic keratosis: Secondary | ICD-10-CM | POA: Diagnosis not present

## 2020-10-13 DIAGNOSIS — C44612 Basal cell carcinoma of skin of right upper limb, including shoulder: Secondary | ICD-10-CM | POA: Diagnosis not present

## 2021-03-03 DIAGNOSIS — R972 Elevated prostate specific antigen [PSA]: Secondary | ICD-10-CM | POA: Diagnosis not present

## 2021-04-13 DIAGNOSIS — C44519 Basal cell carcinoma of skin of other part of trunk: Secondary | ICD-10-CM | POA: Diagnosis not present

## 2021-04-13 DIAGNOSIS — D225 Melanocytic nevi of trunk: Secondary | ICD-10-CM | POA: Diagnosis not present

## 2021-04-13 DIAGNOSIS — L821 Other seborrheic keratosis: Secondary | ICD-10-CM | POA: Diagnosis not present

## 2021-04-13 DIAGNOSIS — Z85828 Personal history of other malignant neoplasm of skin: Secondary | ICD-10-CM | POA: Diagnosis not present

## 2021-04-13 DIAGNOSIS — D2261 Melanocytic nevi of right upper limb, including shoulder: Secondary | ICD-10-CM | POA: Diagnosis not present

## 2021-05-24 DIAGNOSIS — N419 Inflammatory disease of prostate, unspecified: Secondary | ICD-10-CM | POA: Diagnosis not present

## 2021-05-24 DIAGNOSIS — N411 Chronic prostatitis: Secondary | ICD-10-CM | POA: Diagnosis not present

## 2021-05-24 DIAGNOSIS — R972 Elevated prostate specific antigen [PSA]: Secondary | ICD-10-CM | POA: Diagnosis not present

## 2021-05-24 DIAGNOSIS — N4289 Other specified disorders of prostate: Secondary | ICD-10-CM | POA: Diagnosis not present

## 2021-07-10 DIAGNOSIS — H25813 Combined forms of age-related cataract, bilateral: Secondary | ICD-10-CM | POA: Diagnosis not present

## 2021-10-16 DIAGNOSIS — D224 Melanocytic nevi of scalp and neck: Secondary | ICD-10-CM | POA: Diagnosis not present

## 2021-10-16 DIAGNOSIS — Z85828 Personal history of other malignant neoplasm of skin: Secondary | ICD-10-CM | POA: Diagnosis not present

## 2021-10-16 DIAGNOSIS — L821 Other seborrheic keratosis: Secondary | ICD-10-CM | POA: Diagnosis not present

## 2021-10-16 DIAGNOSIS — L738 Other specified follicular disorders: Secondary | ICD-10-CM | POA: Diagnosis not present

## 2021-10-16 DIAGNOSIS — D1801 Hemangioma of skin and subcutaneous tissue: Secondary | ICD-10-CM | POA: Diagnosis not present

## 2021-10-16 DIAGNOSIS — C4441 Basal cell carcinoma of skin of scalp and neck: Secondary | ICD-10-CM | POA: Diagnosis not present

## 2021-10-16 DIAGNOSIS — D485 Neoplasm of uncertain behavior of skin: Secondary | ICD-10-CM | POA: Diagnosis not present

## 2021-11-21 DIAGNOSIS — D485 Neoplasm of uncertain behavior of skin: Secondary | ICD-10-CM | POA: Diagnosis not present

## 2021-11-21 DIAGNOSIS — L988 Other specified disorders of the skin and subcutaneous tissue: Secondary | ICD-10-CM | POA: Diagnosis not present

## 2021-11-23 DIAGNOSIS — M25531 Pain in right wrist: Secondary | ICD-10-CM | POA: Diagnosis not present

## 2021-11-27 DIAGNOSIS — M25531 Pain in right wrist: Secondary | ICD-10-CM | POA: Diagnosis not present

## 2021-11-27 DIAGNOSIS — S52501A Unspecified fracture of the lower end of right radius, initial encounter for closed fracture: Secondary | ICD-10-CM | POA: Diagnosis not present

## 2021-11-28 DIAGNOSIS — G8918 Other acute postprocedural pain: Secondary | ICD-10-CM | POA: Diagnosis not present

## 2021-11-28 DIAGNOSIS — S52571A Other intraarticular fracture of lower end of right radius, initial encounter for closed fracture: Secondary | ICD-10-CM | POA: Diagnosis not present

## 2021-11-28 DIAGNOSIS — Y999 Unspecified external cause status: Secondary | ICD-10-CM | POA: Diagnosis not present

## 2021-11-28 DIAGNOSIS — X58XXXA Exposure to other specified factors, initial encounter: Secondary | ICD-10-CM | POA: Diagnosis not present

## 2021-12-11 DIAGNOSIS — Z4789 Encounter for other orthopedic aftercare: Secondary | ICD-10-CM | POA: Diagnosis not present

## 2021-12-27 DIAGNOSIS — Z4789 Encounter for other orthopedic aftercare: Secondary | ICD-10-CM | POA: Diagnosis not present

## 2021-12-27 DIAGNOSIS — M25531 Pain in right wrist: Secondary | ICD-10-CM | POA: Diagnosis not present

## 2022-01-10 DIAGNOSIS — M25531 Pain in right wrist: Secondary | ICD-10-CM | POA: Diagnosis not present

## 2022-01-24 DIAGNOSIS — K760 Fatty (change of) liver, not elsewhere classified: Secondary | ICD-10-CM | POA: Diagnosis not present

## 2022-01-24 DIAGNOSIS — Z23 Encounter for immunization: Secondary | ICD-10-CM | POA: Diagnosis not present

## 2022-01-24 DIAGNOSIS — E559 Vitamin D deficiency, unspecified: Secondary | ICD-10-CM | POA: Diagnosis not present

## 2022-01-24 DIAGNOSIS — E538 Deficiency of other specified B group vitamins: Secondary | ICD-10-CM | POA: Diagnosis not present

## 2022-01-24 DIAGNOSIS — R972 Elevated prostate specific antigen [PSA]: Secondary | ICD-10-CM | POA: Diagnosis not present

## 2022-01-24 DIAGNOSIS — I359 Nonrheumatic aortic valve disorder, unspecified: Secondary | ICD-10-CM | POA: Diagnosis not present

## 2022-01-24 DIAGNOSIS — R7989 Other specified abnormal findings of blood chemistry: Secondary | ICD-10-CM | POA: Diagnosis not present

## 2022-01-24 DIAGNOSIS — Z0000 Encounter for general adult medical examination without abnormal findings: Secondary | ICD-10-CM | POA: Diagnosis not present

## 2022-01-25 DIAGNOSIS — Z4789 Encounter for other orthopedic aftercare: Secondary | ICD-10-CM | POA: Diagnosis not present

## 2022-01-25 DIAGNOSIS — M25531 Pain in right wrist: Secondary | ICD-10-CM | POA: Diagnosis not present

## 2022-02-02 DIAGNOSIS — I359 Nonrheumatic aortic valve disorder, unspecified: Secondary | ICD-10-CM | POA: Diagnosis not present

## 2022-03-12 NOTE — Progress Notes (Signed)
?Cardiology Office Note:   ? ?Date:  03/13/2022  ? ?ID:  Larry Stephenson, DOB Nov 14, 1955, MRN [id number] ? ?PCP:  Pcp, No ?  ?[facility] Providers ?Cardiologist:  Candee Furbish, MD    ? ?Referring MD: Josetta Huddle, MD  ? ?History of Present Illness:   ? ?Larry Stephenson is a 66 y.o. male here for the evaluation of mitral regurgitation at the request of Dr. Inda Merlin. ? ?Referral notes from Dr. Inda Merlin reviewed. At his appointment on 02/10/2022, they reviewed his 2D Echo ordered in 01/2022 indicated by aortic systolic heart murmur. The Echo revealed mild grade 1 mitral regurgitation, but was otherwise normal. ? ?Today: ?He confirms a heart murmur was heard at his visit with Dr. Inda Merlin. Prior to this he has not been aware of any cardiac issues or heart disease. ? ?For exercise he is running 4-5 times a week. He has been running for years, also played racquetball. He denies any exertional symptoms. ? ?He is a former smoker. Quit many years ago. ? ?In his family, his father also had a heart murmur later in life, but did not require any further work up. His younger brother went into Afib, and underwent ablation. This was unsuccessful and a pacemaker was placed. ? ?He denies any palpitations, chest pain, shortness of breath, or peripheral edema. No lightheadedness, headaches, syncope, orthopnea, or PND. ? ? ?Past Medical History:  ?Diagnosis Date  ? Abnormal LFTs   ? Allergic rhinitis   ? Basal cell carcinoma   ? Dysplastic nevus   ? Elevated PSA   ? Fatty liver   ? Heart murmur   ? Lipoma of axilla   ? Melena   ? Mitral regurgitation   ? Osteoarthritis   ? Positive colorectal cancer screening using Cologuard test   ? Vitamin B 12 deficiency   ? Vitamin D deficiency   ? ? ?Past Surgical History:  ?Procedure Laterality Date  ? COLONOSCOPY WITH PROPOFOL N/A 03/26/2017  ? Procedure: COLONOSCOPY WITH PROPOFOL;  Surgeon: Garlan Fair, MD;  Location: [facility];  Service: Endoscopy;  Laterality: N/A;  [phone]  ? ? ?Current  Medications: ?Current Meds  ?Medication Sig  ? Cholecalciferol (VITAMIN D) 2000 units CAPS Take 1 capsule by mouth daily.   ? Cyanocobalamin (VITAMIN B-12 PO) Take 1 capsule by mouth daily.  ? Glucosamine-MSM-Hyaluronic Acd (JOINT HEALTH PO) Take 8 oz by mouth daily.  ?  ? ?Allergies:   Other and Seasonal ic [cholestatin]  ? ?Social History  ? ?Socioeconomic History  ? Marital status: Married  ?  Spouse name: Not on file  ? Number of children: Not on file  ? Years of education: Not on file  ? Highest education level: Not on file  ?Occupational History  ? Not on file  ?Tobacco Use  ? Smoking status: Never  ? Smokeless tobacco: Never  ?Substance and Sexual Activity  ? Alcohol use: No  ?  Comment: occasional  ? Drug use: No  ? Sexual activity: Not on file  ?Other Topics Concern  ? Not on file  ?Social History Narrative  ? Not on file  ? ?Social Determinants of Health  ? ?Financial Resource Strain: Not on file  ?Food Insecurity: Not on file  ?Transportation Needs: Not on file  ?Physical Activity: Not on file  ?Stress: Not on file  ?Social Connections: Not on file  ?  ? ?Family History: ?The patient's family history is not on file. ? ?ROS:   ?Please see the  history of present illness.    ?All other systems reviewed and are negative. ? ?EKGs/Labs/Other Studies Reviewed:   ? ?The following studies were reviewed today: ? ?No prior cardiovascular studies available. ? ? ?EKG:  EKG is personally reviewed and interpreted. ?03/13/2022: Sinus bradycardia. Rate 58 bpm. ? ?Recent Labs: ?No results found for requested labs within last 8760 hours.  ? ?Recent Lipid Panel ?No results found for: CHOL, TRIG, HDL, CHOLHDL, VLDL, LDLCALC, LDLDIRECT ? ? ?Risk Assessment/Calculations:   ? ? ?    ? ?Physical Exam:   ? ?VS:  BP 116/78   Pulse (!) 58   Ht '6\' 1"'$  (1.854 m)   Wt 179 lb 3.2 oz (81.3 kg)   SpO2 98%   BMI 23.64 kg/m?    ? ?Wt Readings from Last 3 Encounters:  ?03/13/22 179 lb 3.2 oz (81.3 kg)  ?03/26/17 177 lb (80.3 kg)  ?   ? ?GEN: Well nourished, well developed in no acute distress ?HEENT: Normal ?NECK: No JVD; No carotid bruits ?LYMPHATICS: No lymphadenopathy ?CARDIAC: RRR, 2/6 late systolic murmur heard at apex, No rubs, no gallops ?RESPIRATORY:  Clear to auscultation without rales, wheezing or rhonchi  ?ABDOMEN: Soft, non-tender, non-distended ?MUSCULOSKELETAL:  No edema; No deformity  ?SKIN: Warm and dry ?NEUROLOGIC:  Alert and oriented x 3 ?PSYCHIATRIC:  Normal affect  ? ?ASSESSMENT:   ? ?1. Nonrheumatic mitral valve regurgitation   ? ?PLAN:   ? ?In order of problems listed above: ? ?Mitral regurgitation ?ECHO 01/2022: Mild grade 1 mitral regurgitation otherwise normal per report-performed at [facility].  His murmur is somewhat late systolic which goes along with mitral valve prolapse.  We will continue to monitor.  In 1 year we will see him back with repeat echocardiogram.  For now, he will watch for any symptoms such as increased shortness of breath. ? ? ? ? ?Follow-up: 1 year. ? ?Medication Adjustments/Labs and Tests Ordered: ?Current medicines are reviewed at length with the patient today.  Concerns regarding medicines are outlined above.  ? ?Orders Placed This Encounter  ?Procedures  ? EKG 12-Lead  ? ECHOCARDIOGRAM COMPLETE  ? ?No orders of the defined types were placed in this encounter. ? ?Patient Instructions  ?Medication Instructions:  ?The current medical regimen is effective;  continue present plan and medications. ? ?*If you need a refill on your cardiac medications before your next appointment, please call your pharmacy* ? ?Testing/Procedures: ?Your physician has requested that you have an echocardiogram in 1 year. Echocardiography is a painless test that uses sound waves to create images of your heart. It provides your doctor with information about the size and shape of your heart and how well your heart?s chambers and valves are working. This procedure takes approximately one hour. There are no restrictions for this  procedure. ? ? ? ?Follow-Up: ?At [facility], you and your health needs are our priority.  As part of our continuing mission to provide you with exceptional heart care, we have created designated Provider Care Teams.  These Care Teams include your primary Cardiologist (physician) and Advanced Practice Providers (APPs -  Physician Assistants and Nurse Practitioners) who all work together to provide you with the care you need, when you need it. ? ?We recommend signing up for the patient portal called "MyChart".  Sign up information is provided on this After Visit Summary.  MyChart is used to connect with patients for Virtual Visits (Telemedicine).  Patients are able to view lab/test results, encounter notes, upcoming appointments, etc.  Non-urgent messages can be sent to your provider as well.   ?To learn more about what you can do with MyChart, go to [email or web address].   ? ?Your next appointment:   ?1 year(s) ? ?The format for your next appointment:   ?In Person ? ?Provider:   ?Dr Candee Furbish ? ? ? ?Important Information About Sugar ? ? ? ? ?   ? ?I,Mathew Stumpf,acting as a scribe for [employer], MD.,have documented all relevant documentation on the behalf of Candee Furbish, MD,as directed by  Candee Furbish, MD while in the presence of Candee Furbish, MD. ? ?I, Candee Furbish, MD, have reviewed all documentation for this visit. The documentation on 03/13/22 for the exam, diagnosis, procedures, and orders are all accurate and complete.  ? ?Signed, ?Candee Furbish, MD  ?03/13/2022 2:21 PM    ?[place] ?

## 2022-03-13 ENCOUNTER — Ambulatory Visit (INDEPENDENT_AMBULATORY_CARE_PROVIDER_SITE_OTHER): Payer: BLUE CROSS/BLUE SHIELD | Admitting: Cardiology

## 2022-03-13 DIAGNOSIS — I340 Nonrheumatic mitral (valve) insufficiency: Secondary | ICD-10-CM

## 2022-03-13 NOTE — Assessment & Plan Note (Signed)
ECHO 01/2022: Mild grade 1 mitral regurgitation otherwise normal per report-performed at [facility].  His murmur is somewhat late systolic which goes along with mitral valve prolapse.  We will continue to monitor.  In 1 year we will see him back with repeat echocardiogram.  For now, he will watch for any symptoms such as increased shortness of breath. ? ?

## 2022-03-13 NOTE — Patient Instructions (Signed)
Medication Instructions:  ?The current medical regimen is effective;  continue present plan and medications. ? ?*If you need a refill on your cardiac medications before your next appointment, please call your pharmacy* ? ?Testing/Procedures: ?Your physician has requested that you have an echocardiogram in 1 year. Echocardiography is a painless test that uses sound waves to create images of your heart. It provides your doctor with information about the size and shape of your heart and how well your heart?s chambers and valves are working. This procedure takes approximately one hour. There are no restrictions for this procedure. ? ? ? ?Follow-Up: ?At [facility], you and your health needs are our priority.  As part of our continuing mission to provide you with exceptional heart care, we have created designated Provider Care Teams.  These Care Teams include your primary Cardiologist (physician) and Advanced Practice Providers (APPs -  Physician Assistants and Nurse Practitioners) who all work together to provide you with the care you need, when you need it. ? ?We recommend signing up for the patient portal called "MyChart".  Sign up information is provided on this After Visit Summary.  MyChart is used to connect with patients for Virtual Visits (Telemedicine).  Patients are able to view lab/test results, encounter notes, upcoming appointments, etc.  Non-urgent messages can be sent to your provider as well.   ?To learn more about what you can do with MyChart, go to [email or web address].   ? ?Your next appointment:   ?1 year(s) ? ?The format for your next appointment:   ?In Person ? ?Provider:   ?Dr Candee Furbish ? ? ? ?Important Information About Sugar ? ? ? ? ?  ?

## 2022-04-18 DIAGNOSIS — L821 Other seborrheic keratosis: Secondary | ICD-10-CM | POA: Diagnosis not present

## 2022-04-18 DIAGNOSIS — D485 Neoplasm of uncertain behavior of skin: Secondary | ICD-10-CM | POA: Diagnosis not present

## 2022-04-18 DIAGNOSIS — Z85828 Personal history of other malignant neoplasm of skin: Secondary | ICD-10-CM | POA: Diagnosis not present

## 2022-04-18 DIAGNOSIS — C44719 Basal cell carcinoma of skin of left lower limb, including hip: Secondary | ICD-10-CM | POA: Diagnosis not present

## 2022-04-18 DIAGNOSIS — C44311 Basal cell carcinoma of skin of nose: Secondary | ICD-10-CM | POA: Diagnosis not present

## 2022-04-18 DIAGNOSIS — D225 Melanocytic nevi of trunk: Secondary | ICD-10-CM | POA: Diagnosis not present

## 2022-06-21 DIAGNOSIS — C44311 Basal cell carcinoma of skin of nose: Secondary | ICD-10-CM | POA: Diagnosis not present

## 2022-07-23 DIAGNOSIS — H25813 Combined forms of age-related cataract, bilateral: Secondary | ICD-10-CM | POA: Diagnosis not present

## 2022-08-29 NOTE — Progress Notes (Unsigned)
Office Visit    Patient Name: [name] Date of Encounter: 08/30/2022  Primary Care Provider:  Pcp, No Primary Cardiologist:  Candee Furbish, MD Primary Electrophysiologist: None  Chief Complaint    Larry Stephenson is a 66 y.o. male with PMH of mitral regurgitation who presents today for palpitations and irregular heartbeat.  Past Medical History    Past Medical History:  Diagnosis Date   Abnormal LFTs    Allergic rhinitis    Basal cell carcinoma    Dysplastic nevus    Elevated PSA    Fatty liver    Heart murmur    Lipoma of axilla    Melena    Mitral regurgitation    Osteoarthritis    Positive colorectal cancer screening using Cologuard test    Vitamin B 12 deficiency    Vitamin D deficiency    Past Surgical History:  Procedure Laterality Date   COLONOSCOPY WITH PROPOFOL N/A 03/26/2017   Procedure: COLONOSCOPY WITH PROPOFOL;  Surgeon: Garlan Fair, MD;  Location: [facility];  Service: Endoscopy;  Laterality: N/A;  [phone]    Allergies  Allergies  Allergen Reactions   Other     Other reaction(s): Other (See Comments)   Seasonal Ic [Cholestatin]     History of Present Illness    Larry Stephenson  is a 66 year old male with the above mention past medical history who presents today for evaluation of palpitations and irregular heartbeat that was seen on Apple Watch.  [name] was initially seen by Dr. Marlou Porch on 03/2022 for evaluation of mitral regurgitation.  Patient had 2D echo performed by PCP that revealed mild grade 1 mitral regurgitation.  He has an extensive cardiac history with a heart murmur in his father and atrial fibrillation with PPM placement occurring in his brother.  During his office visit patient denied any chest pain, shortness of breath, or peripheral edema.  He was advised to repeat 2D echo and 1 year.  [name] presents today with complaint of atrial fibrillation that was noticed on his Apple watch.  Since last being  seen in the office patient reports that he has been doing well with no complaints of chest pain or shortness of breath.  He states that he exercises and runs 5 days/week and has not experienced any palpitations or tachycardia.  During our visit we discussed the pathophysiology of atrial fibrillation and all patient's questions were answered to his satisfaction.  His blood pressure today was well controlled at 98/76 and heart rate was 53. Patient denies chest pain, palpitations, dyspnea, PND, orthopnea, nausea, vomiting, dizziness, syncope, edema, weight gain, or early satiety.  Home Medications    Current Outpatient Medications  Medication Sig Dispense Refill   Cholecalciferol (VITAMIN D) 2000 units CAPS Take 1 capsule by mouth daily.      Cyanocobalamin (VITAMIN B-12 PO) Take 1 capsule by mouth daily.     No current facility-administered medications for this visit.     Review of Systems  Please see the history of present illness.     All other systems reviewed and are otherwise negative except as noted above.  Physical Exam    Wt Readings from Last 3 Encounters:  08/30/22 179 lb (81.2 kg)  03/13/22 179 lb 3.2 oz (81.3 kg)  03/26/17 177 lb (80.3 kg)   VS: Vitals:   08/30/22 1507  BP: 98/76  Pulse: (!) 53  SpO2: 97%  ,Body mass index is 23.62 kg/m.  Constitutional:      Appearance: Healthy appearance. Not in distress.  Neck:     Vascular: JVD normal.  Pulmonary:     Effort: Pulmonary effort is normal.     Breath sounds: No wheezing. No rales. Diminished in the bases Cardiovascular:     Normal rate. Regular rhythm. Normal S1. Normal S2.      Murmurs: There is no murmur.  Edema:    Peripheral edema absent.  Abdominal:     Palpations: Abdomen is soft non tender. There is no hepatomegaly.  Skin:    General: Skin is warm and dry.  Neurological:     General: No focal deficit present.     Mental Status: Alert and oriented to person, place and time.     Cranial Nerves:  Cranial nerves are intact.  EKG/LABS/Other Studies Reviewed    ECG personally reviewed by me today -sinus bradycardia with occasional PAC and rate of 53 bpm with no acute changes noted.    No results found for: "WBC", "HGB", "HCT", "MCV", "PLT" No results found for: "CREATININE", "BUN", "NA", "K", "CL", "CO2" No results found for: "ALT", "AST", "GGT", "ALKPHOS", "BILITOT" No results found for: "CHOL", "HDL", "LDLCALC", "LDLDIRECT", "TRIG", "CHOLHDL"  No results found for: "HGBA1C"  Assessment & Plan    1.  Irregular heartbeat: -Irregular heartbeat picked up on patient's Apple Watch -We will have patient wear 7-day ZIO monitor to rule out possible atrial fibrillation or SVT  2.  History of mitral regurgitation: -Patient was seen by Dr. Marlou Porch and 03/2022 for evaluation of mitral regurgitation.  Patient had late systolic murmur with plans to repeat 2D echo in 1 year  Disposition: Follow-up with Candee Furbish, MD or APP in 1 month    Medication Adjustments/Labs and Tests Ordered: Current medicines are reviewed at length with the patient today.  Concerns regarding medicines are outlined above.   Signed, Mable Fill, Marissa Nestle, NP 08/30/2022, 4:44 PM [facility]  Note:  This document was prepared using Dragon voice recognition software and may include unintentional dictation errors.

## 2022-08-30 ENCOUNTER — Ambulatory Visit: Payer: BLUE CROSS/BLUE SHIELD | Attending: Nurse Practitioner

## 2022-08-30 ENCOUNTER — Encounter: Payer: Self-pay | Admitting: Nurse Practitioner

## 2022-08-30 ENCOUNTER — Other Ambulatory Visit: Payer: Self-pay | Admitting: Nurse Practitioner

## 2022-08-30 ENCOUNTER — Ambulatory Visit: Payer: BLUE CROSS/BLUE SHIELD | Attending: Nurse Practitioner | Admitting: Nurse Practitioner

## 2022-08-30 VITALS — BP 98/76 | HR 53 | Ht 73.0 in | Wt 179.0 lb

## 2022-08-30 DIAGNOSIS — I499 Cardiac arrhythmia, unspecified: Secondary | ICD-10-CM

## 2022-08-30 DIAGNOSIS — R002 Palpitations: Secondary | ICD-10-CM

## 2022-08-30 DIAGNOSIS — I340 Nonrheumatic mitral (valve) insufficiency: Secondary | ICD-10-CM | POA: Diagnosis not present

## 2022-08-30 NOTE — Patient Instructions (Addendum)
Medication Instructions:  Your physician recommends that you continue on your current medications as directed. Please refer to the Current Medication list given to you today.  *If you need a refill on your cardiac medications before your next appointment, please call your pharmacy*   Lab Work: None If you have labs (blood work) drawn today and your tests are completely normal, you will receive your results only by: [place] (if you have MyChart) OR A paper copy in the mail If you have any lab test that is abnormal or we need to change your treatment, we will call you to review the results.   Follow-Up: At [facility], you and your health needs are our priority.  As part of our continuing mission to provide you with exceptional heart care, we have created designated Provider Care Teams.  These Care Teams include your primary Cardiologist (physician) and Advanced Practice Providers (APPs -  Physician Assistants and Nurse Practitioners) who all work together to provide you with the care you need, when you need it.  We recommend signing up for the patient portal called "MyChart".  Sign up information is provided on this After Visit Summary.  MyChart is used to connect with patients for Virtual Visits (Telemedicine).  Patients are able to view lab/test results, encounter notes, upcoming appointments, etc.  Non-urgent messages can be sent to your provider as well.   To learn more about what you can do with MyChart, go to [email or web address].    Your next appointment:   1 month(s)  The format for your next appointment:   In Person  Provider:   Candee Furbish, MD     [place] Instructions  Your physician has requested you wear a ZIO patch monitor for 14 days.  This is a single patch monitor. [employer] supplies one patch monitor per enrollment. Additional stickers are not available. Please do not apply patch if you will be having a Nuclear Stress Test,   Echocardiogram, Cardiac CT, MRI, or Chest Xray during the period you would be wearing the  monitor. The patch cannot be worn during these tests. You cannot remove and re-apply the  ZIO XT patch monitor.  Your ZIO patch monitor will be mailed 3 day USPS to your address on file. It may take 3-5 days  to receive your monitor after you have been enrolled.  Once you have received your monitor, please review the enclosed instructions. Your monitor  has already been registered assigning a specific monitor serial # to you.  Billing and Patient Assistance Program Information  We have supplied [employer] with any of your insurance information on file for billing purposes. [employer] offers a sliding scale Patient Assistance Program for patients that do not have  insurance, or whose insurance does not completely cover the cost of the ZIO monitor.  You must apply for the Patient Assistance Program to qualify for this discounted rate.  To apply, please call [employer] at [phone], select option 4, select option 2, ask to apply for  Patient Assistance Program. Larry Stephenson will ask your household income, and how many people  are in your household. They will quote your out-of-pocket cost based on that information.  [employer] will also be able to set up a 24-month interest-free payment plan if needed.  Applying the monitor   Shave hair from upper left chest.  Hold abrader disc by orange tab. Rub abrader in 40 strokes over the upper left chest as  indicated in your monitor instructions.  Clean area with 4 enclosed alcohol pads. Let dry.  Apply patch as indicated in monitor instructions. Patch will be placed under collarbone on left  side of chest with arrow pointing upward.  Rub patch adhesive wings for 2 minutes. Remove white label marked "1". Remove the white  label marked "2". Rub patch adhesive wings for 2 additional minutes.  While looking in a mirror, press and release button in center of patch. A small  green light will  flash 3-4 times. This will be your only indicator that the monitor has been turned on.  Do not shower for the first 24 hours. You may shower after the first 24 hours.  Press the button if you feel a symptom. You will hear a small click. Record Date, Time and  Symptom in the Patient Logbook.  When you are ready to remove the patch, follow instructions on the last 2 pages of Patient  Logbook. Stick patch monitor onto the last page of Patient Logbook.  Place Patient Logbook in the blue and white box. Use locking tab on box and tape box closed  securely. The blue and white box has prepaid postage on it. Please place it in the mailbox as  soon as possible. Your physician should have your test results approximately 7 days after the  monitor has been mailed back to [facility].  Call [place] at [phone] if you have questions regarding  your ZIO XT patch monitor. Call them immediately if you see an orange light blinking on your  monitor.  If your monitor falls off in less than 4 days, contact our Monitor department at [phone].  If your monitor becomes loose or falls off after 4 days call [employer] at [phone] for  suggestions on securing your monitor

## 2022-08-30 NOTE — Progress Notes (Unsigned)
Enrolled for [employer] to mail a ZIO XT long term holter monitor to the patients address on file.   Dr. Skains to read. 

## 2022-08-31 NOTE — Addendum Note (Signed)
Addended by: Sharee Holster R on: 08/31/2022 12:07 PM   Modules accepted: Orders

## 2022-09-04 DIAGNOSIS — R002 Palpitations: Secondary | ICD-10-CM | POA: Diagnosis not present

## 2022-09-04 DIAGNOSIS — I499 Cardiac arrhythmia, unspecified: Secondary | ICD-10-CM

## 2022-10-08 ENCOUNTER — Ambulatory Visit: Payer: BLUE CROSS/BLUE SHIELD | Attending: Cardiology | Admitting: Cardiology

## 2022-10-08 ENCOUNTER — Encounter: Payer: Self-pay | Admitting: Cardiology

## 2022-10-08 VITALS — BP 120/60 | HR 59 | Ht 73.0 in | Wt 181.2 lb

## 2022-10-08 DIAGNOSIS — I340 Nonrheumatic mitral (valve) insufficiency: Secondary | ICD-10-CM | POA: Diagnosis not present

## 2022-10-08 DIAGNOSIS — I499 Cardiac arrhythmia, unspecified: Secondary | ICD-10-CM | POA: Diagnosis not present

## 2022-10-08 NOTE — Patient Instructions (Signed)
Medication Instructions:  No changes. *If you need a refill on your cardiac medications before your next appointment, please call your pharmacy*   Lab Work: None. If you have labs (blood work) drawn today and your tests are completely normal, you will receive your results only by: [place] (if you have MyChart) OR A paper copy in the mail If you have any lab test that is abnormal or we need to change your treatment, we will call you to review the results.   Testing/Procedures: Your physician has requested that you have an echocardiogram next year in October of 2024. Echocardiography is a painless test that uses sound waves to create images of your heart. It provides your doctor with information about the size and shape of your heart and how well your heart's chambers and valves are working. This procedure takes approximately one hour. There are no restrictions for this procedure. Please do NOT wear cologne, perfume, aftershave, or lotions (deodorant is allowed). Please arrive 15 minutes prior to your appointment time.    Follow-Up: At [facility], you and your health needs are our priority.  As part of our continuing mission to provide you with exceptional heart care, we have created designated Provider Care Teams.  These Care Teams include your primary Cardiologist (physician) and Advanced Practice Providers (APPs -  Physician Assistants and Nurse Practitioners) who all work together to provide you with the care you need, when you need it.  We recommend signing up for the patient portal called "MyChart".  Sign up information is provided on this After Visit Summary.  MyChart is used to connect with patients for Virtual Visits (Telemedicine).  Patients are able to view lab/test results, encounter notes, upcoming appointments, etc.  Non-urgent messages can be sent to your provider as well.   To learn more about what you can do with MyChart, go to [email or web address].     Your next appointment:   1 year(s)  The format for your next appointment:   In Person  Provider:   Candee Furbish, MD     Important Information About Sugar

## 2022-10-08 NOTE — Progress Notes (Signed)
Cardiology Office Note:    Date:  10/08/2022   ID:  Larry Stephenson, DOB 01/23/1956, MRN [id number]  PCP:  Larry Stephenson No   [facility] Providers Cardiologist:  Larry Furbish, MD     Referring MD: No ref. provider found   History of Present Illness:    Larry Stephenson is a 66 y.o. male with PMHx of MR presenting today for follow up.  She was last seen by me for evaluation of mitral regurgitation at the request of Dr. Inda Stephenson.  Referral notes from Dr. Inda Stephenson reviewed. At his appointment on 02/10/2022, they reviewed his 2D Echo ordered in 01/2022 indicated by aortic systolic heart murmur. The Echo revealed mild grade 1 mitral regurgitation, but was otherwise normal.  He has a apical murmur late systolic.  He is a former smoker. Quit many years ago.  In his family, his father also had a heart murmur later in life, but did not require any further work up. His younger brother went into Afib, and underwent ablation. This was unsuccessful and a pacemaker was placed.  He was last seen by me in May 2023 for consult. He was staying active running 4-5 times a week at that time. He has since worn a cardiac monitor but did not get the repeat echo.  Today, he states that he has been doing well. We reviewed his reassuring cardiac monitor results which showed no A-fib episodes. He did have a symptomatic episode of PVCs while wearing the monitor as well as a 13 second asymptomatic run of atrial tachycardia.  He continues to run most mornings. He started running at age 89.  He denies any palpitations, chest pain, shortness of breath, or peripheral edema. No lightheadedness, headaches, syncope, orthopnea, or PND.  Past Medical History:  Diagnosis Date   Abnormal LFTs    Allergic rhinitis    Basal cell carcinoma    Dysplastic nevus    Elevated PSA    Fatty liver    Heart murmur    Lipoma of axilla    Melena    Mitral regurgitation    Osteoarthritis    Positive colorectal cancer screening using  Cologuard test    Vitamin B 12 deficiency    Vitamin D deficiency     Past Surgical History:  Procedure Laterality Date   COLONOSCOPY WITH PROPOFOL N/A 03/26/2017   Procedure: COLONOSCOPY WITH PROPOFOL;  Surgeon: Larry Fair, MD;  Location: [facility];  Service: Endoscopy;  Laterality: N/A;  [phone]    Current Medications: Current Meds  Medication Sig   Cholecalciferol (VITAMIN D) 2000 units CAPS Take 1 capsule by mouth daily.    Cyanocobalamin (VITAMIN B-12 PO) Take 1 capsule by mouth daily.     Allergies:   Other and Seasonal ic [cholestatin]   Social History   Socioeconomic History   Marital status: Married    Spouse name: Not on file   Number of children: Not on file   Years of education: Not on file   Highest education level: Not on file  Occupational History   Not on file  Tobacco Use   Smoking status: Never   Smokeless tobacco: Never  Substance and Sexual Activity   Alcohol use: No    Comment: occasional   Drug use: No   Sexual activity: Not on file  Other Topics Concern   Not on file  Social History Narrative   Not on file   Social Determinants of Health   Financial Resource Strain: Not on  file  Food Insecurity: Not on file  Transportation Needs: Not on file  Physical Activity: Not on file  Stress: Not on file  Social Connections: Not on file     Family History: The patient's family history is not on file.  ROS:   Please see the history of present illness.    All other systems reviewed and are negative.  EKGs/Labs/Other Studies Reviewed:    The following studies were reviewed today:  Cardiac monitor 10/03/22:   Sinus rhythm average heart rate 60 bpm   Atrial tachycardia occasional runs noted.  Benign.   Occasional PACs, rare PVCs noted   No atrial fibrillation.   EKG:  EKG is personally reviewed and interpreted. 03/13/2022: Sinus bradycardia. Rate 58 bpm.  Recent Labs: No results found for requested labs within last 365  days.   Recent Lipid Panel No results found for: "CHOL", "TRIG", "HDL", "CHOLHDL", "VLDL", "LDLCALC", "LDLDIRECT"   Risk Assessment/Calculations:          Physical Exam:    VS:  BP 120/60   Pulse (!) 59   Ht '6\' 1"'$  (1.854 m)   Wt 181 lb 3.2 oz (82.2 kg)   SpO2 98%   BMI 23.91 kg/m     Wt Readings from Last 3 Encounters:  10/08/22 181 lb 3.2 oz (82.2 kg)  08/30/22 179 lb (81.2 kg)  03/13/22 179 lb 3.2 oz (81.3 kg)     GEN: Well nourished, well developed in no acute distress HEENT: Normal NECK: No JVD; No carotid bruits LYMPHATICS: No lymphadenopathy CARDIAC: RRR, 2/6 late systolic murmur heard at apex, no rubs or gallops, occasional ectopy  RESPIRATORY:  Clear to auscultation without rales, wheezing or rhonchi  ABDOMEN: Soft, non-tender, non-distended MUSCULOSKELETAL:  No edema; No deformity  SKIN: Warm and dry NEUROLOGIC:  Alert and oriented x 3 PSYCHIATRIC:  Normal affect   ASSESSMENT:    1. Irregular heartbeat   2. Nonrheumatic mitral valve regurgitation     PLAN:    In order of problems listed above:  Irregular heartbeat ZIO monitor reviewed.  Occasional PACs, brief episodes of atrial tachycardia present.  No atrial fibrillation present.  Showed him monitor results.  No changes made.  Conservative management.  Avoid stimulants excessive caffeine. He was concerned because his brother has had episodes of atrial fibrillation detected on his Apple Watch as well.   Mitral regurgitation ECHO 01/2022: Mild grade 1 mitral regurgitation otherwise normal per report-performed at [facility].  His murmur is somewhat late systolic which goes along with mitral valve prolapse.  We will continue to monitor.  In 1 year we will see him back with repeat echocardiogram.  For now, he will watch for any symptoms such as increased shortness of breath.  No other changes.   Follow-up: 1 year  Medication Adjustments/Labs and Tests Ordered: Current medicines are reviewed at length with  the patient today.  Concerns regarding medicines are outlined above.   Orders Placed This Encounter  Procedures   ECHOCARDIOGRAM COMPLETE   No orders of the defined types were placed in this encounter.  Patient Instructions  Medication Instructions:  No changes. *If you need a refill on your cardiac medications before your next appointment, please call your pharmacy*   Lab Work: None. If you have labs (blood work) drawn today and your tests are completely normal, you will receive your results only by: [place] (if you have MyChart) OR A paper copy in the mail If you have any lab test that is  abnormal or we need to change your treatment, we will call you to review the results.   Testing/Procedures: Your physician has requested that you have an echocardiogram next year in October of 2024. Echocardiography is a painless test that uses sound waves to create images of your heart. It provides your doctor with information about the size and shape of your heart and how well your heart's chambers and valves are working. This procedure takes approximately one hour. There are no restrictions for this procedure. Please do NOT wear cologne, perfume, aftershave, or lotions (deodorant is allowed). Please arrive 15 minutes prior to your appointment time.    Follow-Up: At [facility], you and your health needs are our priority.  As part of our continuing mission to provide you with exceptional heart care, we have created designated Provider Care Teams.  These Care Teams include your primary Cardiologist (physician) and Advanced Practice Providers (APPs -  Physician Assistants and Nurse Practitioners) who all work together to provide you with the care you need, when you need it.  We recommend signing up for the patient portal called "MyChart".  Sign up information is provided on this After Visit Summary.  MyChart is used to connect with patients for Virtual Visits (Telemedicine).   Patients are able to view lab/test results, encounter notes, upcoming appointments, etc.  Non-urgent messages can be sent to your provider as well.   To learn more about what you can do with MyChart, go to [email or web address].    Your next appointment:   1 year(s)  The format for your next appointment:   In Person  Provider:   Candee Furbish, MD     Important Information About Sugar          Signed, Larry Furbish, MD  10/08/2022 5:25 PM    [place] as a scribe for Larry Furbish, MD.,have documented all relevant documentation on the behalf of Larry Furbish, MD,as directed by  Larry Furbish, MD while in the presence of Larry Furbish, MD.  I, Larry Furbish, MD, have reviewed all documentation for this visit. The documentation on 10/08/22 for the exam, diagnosis, procedures, and orders are all accurate and complete.

## 2022-10-18 DIAGNOSIS — Z85828 Personal history of other malignant neoplasm of skin: Secondary | ICD-10-CM | POA: Diagnosis not present

## 2022-10-18 DIAGNOSIS — C44311 Basal cell carcinoma of skin of nose: Secondary | ICD-10-CM | POA: Diagnosis not present

## 2022-10-18 DIAGNOSIS — D485 Neoplasm of uncertain behavior of skin: Secondary | ICD-10-CM | POA: Diagnosis not present

## 2022-10-18 DIAGNOSIS — C44619 Basal cell carcinoma of skin of left upper limb, including shoulder: Secondary | ICD-10-CM | POA: Diagnosis not present

## 2022-10-18 DIAGNOSIS — D2262 Melanocytic nevi of left upper limb, including shoulder: Secondary | ICD-10-CM | POA: Diagnosis not present

## 2022-10-18 DIAGNOSIS — C44719 Basal cell carcinoma of skin of left lower limb, including hip: Secondary | ICD-10-CM | POA: Diagnosis not present

## 2022-10-18 DIAGNOSIS — C44519 Basal cell carcinoma of skin of other part of trunk: Secondary | ICD-10-CM | POA: Diagnosis not present

## 2022-10-18 DIAGNOSIS — D225 Melanocytic nevi of trunk: Secondary | ICD-10-CM | POA: Diagnosis not present

## 2022-10-18 DIAGNOSIS — D2261 Melanocytic nevi of right upper limb, including shoulder: Secondary | ICD-10-CM | POA: Diagnosis not present

## 2022-12-04 DIAGNOSIS — C44311 Basal cell carcinoma of skin of nose: Secondary | ICD-10-CM | POA: Diagnosis not present

## 2023-01-10 DIAGNOSIS — L718 Other rosacea: Secondary | ICD-10-CM | POA: Diagnosis not present

## 2023-04-01 DIAGNOSIS — N400 Enlarged prostate without lower urinary tract symptoms: Secondary | ICD-10-CM | POA: Diagnosis not present

## 2023-04-01 DIAGNOSIS — R972 Elevated prostate specific antigen [PSA]: Secondary | ICD-10-CM | POA: Diagnosis not present

## 2023-04-03 ENCOUNTER — Other Ambulatory Visit: Payer: Self-pay | Admitting: Urology

## 2023-04-03 DIAGNOSIS — R972 Elevated prostate specific antigen [PSA]: Secondary | ICD-10-CM

## 2023-04-22 DIAGNOSIS — C44612 Basal cell carcinoma of skin of right upper limb, including shoulder: Secondary | ICD-10-CM | POA: Diagnosis not present

## 2023-04-22 DIAGNOSIS — Z85828 Personal history of other malignant neoplasm of skin: Secondary | ICD-10-CM | POA: Diagnosis not present

## 2023-04-22 DIAGNOSIS — D225 Melanocytic nevi of trunk: Secondary | ICD-10-CM | POA: Diagnosis not present

## 2023-04-22 DIAGNOSIS — D2262 Melanocytic nevi of left upper limb, including shoulder: Secondary | ICD-10-CM | POA: Diagnosis not present

## 2023-04-22 DIAGNOSIS — D2261 Melanocytic nevi of right upper limb, including shoulder: Secondary | ICD-10-CM | POA: Diagnosis not present

## 2023-04-22 DIAGNOSIS — D485 Neoplasm of uncertain behavior of skin: Secondary | ICD-10-CM | POA: Diagnosis not present

## 2023-04-23 DIAGNOSIS — E559 Vitamin D deficiency, unspecified: Secondary | ICD-10-CM | POA: Diagnosis not present

## 2023-04-23 DIAGNOSIS — E538 Deficiency of other specified B group vitamins: Secondary | ICD-10-CM | POA: Diagnosis not present

## 2023-04-23 DIAGNOSIS — R972 Elevated prostate specific antigen [PSA]: Secondary | ICD-10-CM | POA: Diagnosis not present

## 2023-04-23 DIAGNOSIS — E7800 Pure hypercholesterolemia, unspecified: Secondary | ICD-10-CM | POA: Diagnosis not present

## 2023-04-23 DIAGNOSIS — Z0000 Encounter for general adult medical examination without abnormal findings: Secondary | ICD-10-CM | POA: Diagnosis not present

## 2023-04-23 DIAGNOSIS — R945 Abnormal results of liver function studies: Secondary | ICD-10-CM | POA: Diagnosis not present

## 2023-04-23 DIAGNOSIS — K760 Fatty (change of) liver, not elsewhere classified: Secondary | ICD-10-CM | POA: Diagnosis not present

## 2023-04-24 ENCOUNTER — Encounter: Payer: Self-pay | Admitting: Internal Medicine

## 2023-05-20 ENCOUNTER — Ambulatory Visit
Admission: RE | Admit: 2023-05-20 | Discharge: 2023-05-20 | Disposition: A | Discharge status: Home / Self Care | Payer: BLUE CROSS/BLUE SHIELD | Source: Ambulatory Visit | Attending: Urology | Admitting: Urology

## 2023-05-20 DIAGNOSIS — R972 Elevated prostate specific antigen [PSA]: Secondary | ICD-10-CM

## 2023-05-20 MED ORDER — GADOPICLENOL 0.5 MMOL/ML IV SOLN
8.0000 mL | Freq: Once | INTRAVENOUS | Status: AC | PRN
  Administered 2023-05-20: 8 mL via INTRAVENOUS

## 2023-07-25 DIAGNOSIS — H25813 Combined forms of age-related cataract, bilateral: Secondary | ICD-10-CM | POA: Diagnosis not present

## 2023-07-25 DIAGNOSIS — L03116 Cellulitis of left lower limb: Secondary | ICD-10-CM | POA: Diagnosis not present

## 2023-09-06 ENCOUNTER — Ambulatory Visit (HOSPITAL_COMMUNITY): Payer: BLUE CROSS/BLUE SHIELD | Attending: Internal Medicine

## 2023-09-06 DIAGNOSIS — I340 Nonrheumatic mitral (valve) insufficiency: Secondary | ICD-10-CM | POA: Insufficient documentation

## 2023-09-06 DIAGNOSIS — I499 Cardiac arrhythmia, unspecified: Secondary | ICD-10-CM | POA: Diagnosis not present

## 2023-09-06 LAB — ECHOCARDIOGRAM COMPLETE
Area-P 1/2: 2.62 cm2
Calc EF: 57.9 %
MV M vel: 5.26 m/s
MV Peak grad: 110.7 mm[Hg]
Radius: 0.6 cm
S' Lateral: 3.4 cm
Single Plane A2C EF: 59.8 %
Single Plane A4C EF: 57.6 %

## 2023-09-13 ENCOUNTER — Telehealth: Payer: Self-pay | Admitting: Cardiology

## 2023-09-13 NOTE — Telephone Encounter (Signed)
  Moderate to severe mitral regurgitation with eccentric jet. Ascending aorta measured 40 mm.  Mildly dilated. I think it would make sense for [place] to set him up for a transesophageal echocardiogram for further evaluation of his mitral valve.  Please have him come to clinic for TEE office visit and scheduling.  Written by Jake Bathe, MD on 09/11/2023  2:48 PM EDT Seen by patient Larry Stephenson on 09/13/2023  9:54 AM  Left message for pt to call back to discuss results and schedule appt to be seen in clinic.

## 2023-09-13 NOTE — Telephone Encounter (Signed)
Patient would like a call back to discuss scheduling TEE, based on echo results.

## 2023-09-13 NOTE — Telephone Encounter (Signed)
Pt is aware of results and need for appt to discuss TEE.  Appt scheduled for 09/24/23 at 2:20 pm.

## 2023-09-23 ENCOUNTER — Other Ambulatory Visit: Payer: Self-pay | Admitting: Cardiology

## 2023-09-23 ENCOUNTER — Ambulatory Visit: Payer: BLUE CROSS/BLUE SHIELD | Attending: Cardiology | Admitting: Cardiology

## 2023-09-23 ENCOUNTER — Encounter: Payer: Self-pay | Admitting: *Deleted

## 2023-09-23 ENCOUNTER — Encounter: Payer: Self-pay | Admitting: Cardiology

## 2023-09-23 VITALS — BP 140/96 | HR 59 | Ht 73.0 in | Wt 179.0 lb

## 2023-09-23 DIAGNOSIS — Z01812 Encounter for preprocedural laboratory examination: Secondary | ICD-10-CM

## 2023-09-23 DIAGNOSIS — I499 Cardiac arrhythmia, unspecified: Secondary | ICD-10-CM | POA: Diagnosis not present

## 2023-09-23 DIAGNOSIS — R002 Palpitations: Secondary | ICD-10-CM

## 2023-09-23 DIAGNOSIS — I340 Nonrheumatic mitral (valve) insufficiency: Secondary | ICD-10-CM

## 2023-09-23 NOTE — H&P (View-Only) (Signed)
 Cardiology Office Note:  .   Date:  09/23/2023  ID:  Larry Stephenson, DOB May 03, 1956, MRN [id number] PCP: Larry Stephenson, No  [facility] Providers Cardiologist:  Larry Schultz, MD     History of Present Illness: .   Larry Stephenson is a 67 y.o. male Discussed with the use of AI scribe  History of Present Illness   The patient, with a history of mitral valve regurgitation, was brought in for a consultation due to a recent echocardiogram report indicating potential moderate to severe MR.  This was a change from a previous echocardiogram in 2023, which reported only mild regurg.   Despite this, the patient has remained highly active and has not noticed any changes in symptoms, such as increased shortness of breath or a decline in physical ability.  The patient has also experienced a few episodes of atrial fibrillation, as detected by their watch, but this was not confirmed during a subsequent monitoring period. The patient has not reported any other symptoms or changes in their health status.  The patient's blood pressure was noted to be slightly elevated during the consultation, but they have been tracking it and it appears to be within normal limits outside of the clinical setting.       Younger brother-atrial fibrillation ablation Former smoker Avid runner started running at age 56      Studies Reviewed: Marland Kitchen   EKG Interpretation Date/Time:  Monday September 23 2023 09:18:08 EST Ventricular Rate:  59 PR Interval:  168 QRS Duration:  98 QT Interval:  432 QTC Calculation: 427 R Axis:   23  Text Interpretation: Sinus bradycardia  with PAC's No previous ECGs available Confirmed by Larry Stephenson ([id number]) on 09/23/2023 9:29:09 AM    Echocardiogram-08/2023-moderate to severe eccentric mitral regurgitation.  Prior echocardiogram performed at [facility] in 2023 showed reportedly grade 1 mild mitral regurgitation.    Cardiac monitor 2023-occasional PACs PVCs and brief atrial tachycardia no  atrial fibrillation Risk Assessment/Calculations:           Physical Exam:   VS:  BP (!) 140/96   Pulse (!) 59   Ht 6\' 1"  (1.854 m)   Wt 179 lb (81.2 kg)   SpO2 95%   BMI 23.62 kg/m    Wt Readings from Last 3 Encounters:  09/23/23 179 lb (81.2 kg)  10/08/22 181 lb 3.2 oz (82.2 kg)  08/30/22 179 lb (81.2 kg)    GEN: Well nourished, well developed in no acute distress NECK: No JVD; No carotid bruits CARDIAC: RRR, 2/6 late systolic murmur, no rubs, no gallops RESPIRATORY:  Clear to auscultation without rales, wheezing or rhonchi  ABDOMEN: Soft, non-tender, non-distended EXTREMITIES:  No edema; No deformity   ASSESSMENT AND PLAN: .    Assessment and Plan    Mitral Regurgitation Initially mild in 2023, now potentially moderate to severe per recent echocardiogram. Asymptomatic with no changes in exercise tolerance or symptoms. Discussed need for transesophageal echocardiogram (TEE) to better quantify severity. TEE is minimally invasive with a very low risk of complications (1 in 10,000 for throat or esophageal damage). Explained that TEE provides high-definition images for better assessment. If severe, future management may include surgical repair if symptomatic or if heart shows signs of stress. Patient agreed to proceed with TEE. - Schedule transesophageal echocardiogram (TEE) - Follow up in six months  Atrial Fibrillation possible Episodes detected by a watch but not confirmed on subsequent monitor. Discussed that if atrial fibrillation becomes more recurrent, valve repair and  ablation might be considered. Explained that severe mitral regurgitation can lead to atrial fibrillation and that repair would precede ablation if needed. - Monitor for recurrent atrial fibrillation  Hypertension Blood pressure slightly elevated during visit, likely due to white coat syndrome. Home monitoring shows normal limits. - Continue home blood pressure monitoring  Former smoker - Quit many years  ago  Follow-up - Follow up in six months.              Informed Consent   Shared Decision Making/Informed Consent   The risks [esophageal damage, perforation (1:10,000 risk), bleeding, pharyngeal hematoma as well as other potential complications associated with conscious sedation including aspiration, arrhythmia, respiratory failure and death], benefits (treatment guidance and diagnostic support) and alternatives of a transesophageal echocardiogram were discussed in detail with Larry Stephenson and he is willing to proceed.        Signed, Larry Schultz, MD

## 2023-09-23 NOTE — Progress Notes (Signed)
Cardiology Office Note:  .   Date:  09/23/2023  ID:  Larry Stephenson, DOB May 03, 1956, MRN [id number] PCP: Larry Stephenson, No  [facility] Providers Cardiologist:  Larry Schultz, MD     History of Present Illness: .   Larry Stephenson is a 67 y.o. male Discussed with the use of AI scribe  History of Present Illness   The patient, with a history of mitral valve regurgitation, was brought in for a consultation due to a recent echocardiogram report indicating potential moderate to severe MR.  This was a change from a previous echocardiogram in 2023, which reported only mild regurg.   Despite this, the patient has remained highly active and has not noticed any changes in symptoms, such as increased shortness of breath or a decline in physical ability.  The patient has also experienced a few episodes of atrial fibrillation, as detected by their watch, but this was not confirmed during a subsequent monitoring period. The patient has not reported any other symptoms or changes in their health status.  The patient's blood pressure was noted to be slightly elevated during the consultation, but they have been tracking it and it appears to be within normal limits outside of the clinical setting.       Younger brother-atrial fibrillation ablation Former smoker Avid runner started running at age 56      Studies Reviewed: Larry Stephenson Kitchen   EKG Interpretation Date/Time:  Monday September 23 2023 09:18:08 EST Ventricular Rate:  59 PR Interval:  168 QRS Duration:  98 QT Interval:  432 QTC Calculation: 427 R Axis:   23  Text Interpretation: Sinus bradycardia  with PAC's No previous ECGs available Confirmed by Larry Stephenson ([id number]) on 09/23/2023 9:29:09 AM    Echocardiogram-08/2023-moderate to severe eccentric mitral regurgitation.  Prior echocardiogram performed at [facility] in 2023 showed reportedly grade 1 mild mitral regurgitation.    Cardiac monitor 2023-occasional PACs PVCs and brief atrial tachycardia no  atrial fibrillation Risk Assessment/Calculations:           Physical Exam:   VS:  BP (!) 140/96   Pulse (!) 59   Ht 6\' 1"  (1.854 m)   Wt 179 lb (81.2 kg)   SpO2 95%   BMI 23.62 kg/m    Wt Readings from Last 3 Encounters:  09/23/23 179 lb (81.2 kg)  10/08/22 181 lb 3.2 oz (82.2 kg)  08/30/22 179 lb (81.2 kg)    GEN: Well nourished, well developed in no acute distress NECK: No JVD; No carotid bruits CARDIAC: RRR, 2/6 late systolic murmur, no rubs, no gallops RESPIRATORY:  Clear to auscultation without rales, wheezing or rhonchi  ABDOMEN: Soft, non-tender, non-distended EXTREMITIES:  No edema; No deformity   ASSESSMENT AND PLAN: .    Assessment and Plan    Mitral Regurgitation Initially mild in 2023, now potentially moderate to severe per recent echocardiogram. Asymptomatic with no changes in exercise tolerance or symptoms. Discussed need for transesophageal echocardiogram (TEE) to better quantify severity. TEE is minimally invasive with a very low risk of complications (1 in 10,000 for throat or esophageal damage). Explained that TEE provides high-definition images for better assessment. If severe, future management may include surgical repair if symptomatic or if heart shows signs of stress. Patient agreed to proceed with TEE. - Schedule transesophageal echocardiogram (TEE) - Follow up in six months  Atrial Fibrillation possible Episodes detected by a watch but not confirmed on subsequent monitor. Discussed that if atrial fibrillation becomes more recurrent, valve repair and  ablation might be considered. Explained that severe mitral regurgitation can lead to atrial fibrillation and that repair would precede ablation if needed. - Monitor for recurrent atrial fibrillation  Hypertension Blood pressure slightly elevated during visit, likely due to white coat syndrome. Home monitoring shows normal limits. - Continue home blood pressure monitoring  Former smoker - Quit many years  ago  Follow-up - Follow up in six months.              Informed Consent   Shared Decision Making/Informed Consent   The risks [esophageal damage, perforation (1:10,000 risk), bleeding, pharyngeal hematoma as well as other potential complications associated with conscious sedation including aspiration, arrhythmia, respiratory failure and death], benefits (treatment guidance and diagnostic support) and alternatives of a transesophageal echocardiogram were discussed in detail with Larry Stephenson and he is willing to proceed.        Signed, Larry Schultz, MD

## 2023-09-23 NOTE — Patient Instructions (Signed)
Medication Instructions:  The current medical regimen is effective;  continue present plan and medications.  *If you need a refill on your cardiac medications before your next appointment, please call your pharmacy*   Lab Work: Please have blood work today (BMP,CBC)  If you have labs (blood work) drawn today and your tests are completely normal, you will receive your results only by: MyChart Message (if you have MyChart) OR A paper copy in the mail If you have any lab test that is abnormal or we need to change your treatment, we will call you to review the results.   Testing/Procedures: Your physician has requested that you have a TEE. During a TEE, sound waves are used to create images of your heart. It provides your doctor with information about the size and shape of your heart and how well your heart's chambers and valves are working. In this test, a transducer is attached to the end of a flexible tube that's guided down your throat and into your esophagus (the tube leading from you mouth to your stomach) to get a more detailed image of your heart. You are not awake for the procedure. Please see the instruction sheet given to you today. For further information please visit [email or web address]   Follow-Up: At [facility], you and your health needs are our priority.  As part of our continuing mission to provide you with exceptional heart care, we have created designated Provider Care Teams.  These Care Teams include your primary Cardiologist (physician) and Advanced Practice Providers (APPs -  Physician Assistants and Nurse Practitioners) who all work together to provide you with the care you need, when you need it.  We recommend signing up for the patient portal called "MyChart".  Sign up information is provided on this After Visit Summary.  MyChart is used to connect with patients for Virtual Visits (Telemedicine).  Patients are able to view lab/test results, encounter notes,  upcoming appointments, etc.  Non-urgent messages can be sent to your provider as well.   To learn more about what you can do with MyChart, go to [email or web address].    Your next appointment:   6 month(s)  Provider:   Donato Schultz, MD

## 2023-09-24 ENCOUNTER — Ambulatory Visit: Payer: BLUE CROSS/BLUE SHIELD | Admitting: Cardiology

## 2023-09-24 ENCOUNTER — Telehealth: Payer: Self-pay | Admitting: Cardiology

## 2023-09-24 LAB — CBC
Hematocrit: 42.6 % (ref 37.5–51.0)
Hemoglobin: 14.1 g/dL (ref 13.0–17.7)
MCH: 29.6 pg (ref 26.6–33.0)
MCHC: 33.1 g/dL (ref 31.5–35.7)
MCV: 90 fL (ref 79–97)
Platelets: 177 10*3/uL (ref 150–450)
RBC: 4.76 x10E6/uL (ref 4.14–5.80)
RDW: 12.7 % (ref 11.6–15.4)
WBC: 4.7 10*3/uL (ref 3.4–10.8)

## 2023-09-24 LAB — BASIC METABOLIC PANEL
BUN/Creatinine Ratio: 26 — ABNORMAL HIGH (ref 10–24)
BUN: 21 mg/dL (ref 8–27)
CO2: 24 mmol/L (ref 20–29)
Calcium: 9.5 mg/dL (ref 8.6–10.2)
Chloride: 105 mmol/L (ref 96–106)
Creatinine, Ser: 0.8 mg/dL (ref 0.76–1.27)
Glucose: 101 mg/dL — ABNORMAL HIGH (ref 70–99)
Potassium: 4.3 mmol/L (ref 3.5–5.2)
Sodium: 141 mmol/L (ref 134–144)
eGFR: 97 mL/min/{1.73_m2} (ref >59)

## 2023-09-24 NOTE — Telephone Encounter (Signed)
  Pt need to reschedule his TEE. He said, something came up this week and couldn't do it. He said, any day next week is fine except Wednesday, early morning is also fine. His first choice will be Monday next week 11/11

## 2023-09-24 NOTE — Telephone Encounter (Signed)
TEE rescheduled at pt's request for Monday 09/30/23 at 12:30 pm - arrive at 11:30 am.  This will be with Dr Jerene Pitch. Pt is aware of appt date and time.  He has also reviewed lab results via MyChart.

## 2023-09-27 NOTE — OR Nursing (Signed)
 Called patient with pre-procedure instructions for tomorrow.   Patient informed of:   Time to arrive for procedure. 0945 Remain NPO past midnight.  Must have a ride home and a responsible adult to remain with them for 24 ours post procedure.  Confirmed blood thinner. Confirmed no breaks in taking blood thinner for 3+ weeks prior to procedure. Confirmed patient stopped all GLP-1s and GLP-2s for at least one week before procedure.   Spoke with patient regarding above information.

## 2023-09-30 ENCOUNTER — Ambulatory Visit (HOSPITAL_COMMUNITY): Payer: BLUE CROSS/BLUE SHIELD | Admitting: Certified Registered Nurse Anesthetist

## 2023-09-30 ENCOUNTER — Encounter (HOSPITAL_COMMUNITY)
Admission: RE | Disposition: A | Discharge status: Home / Self Care | Payer: Self-pay | Source: Home / Self Care | Attending: Cardiology

## 2023-09-30 ENCOUNTER — Other Ambulatory Visit: Payer: Self-pay

## 2023-09-30 ENCOUNTER — Ambulatory Visit (HOSPITAL_BASED_OUTPATIENT_CLINIC_OR_DEPARTMENT_OTHER)
Admission: RE | Admit: 2023-09-30 | Discharge: 2023-09-30 | Disposition: A | Discharge status: Home / Self Care | Payer: BLUE CROSS/BLUE SHIELD | Source: Ambulatory Visit | Attending: Cardiology | Admitting: Cardiology

## 2023-09-30 ENCOUNTER — Ambulatory Visit (HOSPITAL_COMMUNITY)
Admission: RE | Admit: 2023-09-30 | Discharge: 2023-09-30 | Disposition: A | Discharge status: Home / Self Care | Payer: BLUE CROSS/BLUE SHIELD | Attending: Cardiology | Admitting: Cardiology

## 2023-09-30 ENCOUNTER — Encounter (HOSPITAL_COMMUNITY): Payer: Self-pay | Admitting: Cardiology

## 2023-09-30 DIAGNOSIS — I10 Essential (primary) hypertension: Secondary | ICD-10-CM | POA: Insufficient documentation

## 2023-09-30 DIAGNOSIS — I340 Nonrheumatic mitral (valve) insufficiency: Secondary | ICD-10-CM | POA: Diagnosis not present

## 2023-09-30 DIAGNOSIS — Z87891 Personal history of nicotine dependence: Secondary | ICD-10-CM | POA: Insufficient documentation

## 2023-09-30 HISTORY — PX: TRANSESOPHAGEAL ECHOCARDIOGRAM (CATH LAB): EP1270

## 2023-09-30 LAB — ECHO TEE
MV M vel: 5 m/s
MV Peak grad: 100 mm[Hg]
Radius: 1 cm

## 2023-09-30 SURGERY — TRANSESOPHAGEAL ECHOCARDIOGRAM (TEE) (CATHLAB)
Anesthesia: Monitor Anesthesia Care

## 2023-09-30 MED ORDER — PROPOFOL 500 MG/50ML IV EMUL
INTRAVENOUS | Status: DC | PRN
  Administered 2023-09-30: 100 ug/kg/min via INTRAVENOUS

## 2023-09-30 MED ORDER — PROPOFOL 10 MG/ML IV BOLUS
INTRAVENOUS | Status: DC | PRN
  Administered 2023-09-30 (×2): 50 mg via INTRAVENOUS

## 2023-09-30 MED ORDER — PHENYLEPHRINE 80 MCG/ML (10ML) SYRINGE FOR IV PUSH (FOR BLOOD PRESSURE SUPPORT)
PREFILLED_SYRINGE | INTRAVENOUS | Status: DC | PRN
  Administered 2023-09-30 (×3): 160 ug via INTRAVENOUS

## 2023-09-30 MED ORDER — SODIUM CHLORIDE 0.9 % IV SOLN: INTRAVENOUS | Status: DC | PRN

## 2023-09-30 NOTE — CV Procedure (Signed)
     TRANSESOPHAGEAL ECHOCARDIOGRAM   NAME:  Larry Stephenson   MRN: [id number] DOB:  [birth year]   ADMIT DATE: 09/30/2023  INDICATIONS: Mitral regurgitation  PROCEDURE:   Informed consent was obtained prior to the procedure. The risks, benefits and alternatives for the procedure were discussed and the patient comprehended these risks.  Risks include, but are not limited to, cough, sore throat, vomiting, nausea, somnolence, esophageal and stomach trauma or perforation, bleeding, low blood pressure, aspiration, pneumonia, infection, trauma to the teeth and death.    After a procedural time-out, the oropharynx was anesthetized and the patient was sedated by the anesthesia service. The transesophageal probe was inserted in the esophagus and stomach without difficulty and multiple views were obtained. Anesthesia was monitored by Janeann Forehand, CRNA.    COMPLICATIONS:    There were no immediate complications.  FINDINGS:  Flail P2.  Moderate to severe MR.  Epifanio Lesches MD [facility]  [address] [place], [place] [address] [phone]   4:13 PM

## 2023-09-30 NOTE — Discharge Instructions (Signed)
TEE   YOU HAD AN CARDIAC PROCEDURE TODAY: Refer to the procedure report and other information in the discharge instructions given to you for any specific questions about what was found during the examination. If this information does not answer your questions, please call Dr. Ganji's office at [phone] to clarify.   DIET: Your first meal following the procedure should be a light meal and then it is ok to progress to your normal diet. A half-sandwich or bowl of soup is an example of a good first meal. Heavy or fried foods are harder to digest and may make you feel nauseous or bloated. Drink plenty of fluids but you should avoid alcoholic beverages for 24 hours. If you had a esophageal dilation, please see attached instructions for diet.   ACTIVITY: Your care partner should take you home directly after the procedure. You should plan to take it easy, moving slowly for the rest of the day. You can resume normal activity the day after the procedure however YOU SHOULD NOT DRIVE, use power tools, machinery or perform tasks that involve climbing or major physical exertion for 24 hours (because of the sedation medicines used during the test).   SYMPTOMS TO REPORT IMMEDIATELY: A cardiologist can be reached at any hour. Please call [phone] for any of the following symptoms:  Vomiting of blood or coffee ground material  New, significant abdominal pain  New, significant chest pain or pain under the shoulder blades  Painful or persistently difficult swallowing  New shortness of breath  Black, tarry-looking or red, bloody stools  FOLLOW UP:  Please also call with any specific questions about appointments or follow up tests. 

## 2023-09-30 NOTE — Anesthesia Preprocedure Evaluation (Signed)
Anesthesia Evaluation  Patient identified by MRN, date of birth, ID band Patient awake    Reviewed: Allergy & Precautions, H&P , NPO status , Patient's Chart, lab work & pertinent test results  Airway Mallampati: II  TM Distance: >3 FB Neck ROM: Full    Dental no notable dental hx.    Pulmonary neg pulmonary ROS   Pulmonary exam normal breath sounds clear to auscultation       Cardiovascular + Valvular Problems/Murmurs MR  Rhythm:Regular Rate:Normal + Systolic murmurs 1. Left ventricular ejection fraction, by estimation, is 55 to 60%. The  left ventricle has normal function. The left ventricle has no regional  wall motion abnormalities. Left ventricular diastolic parameters are  indeterminate. The average left  ventricular global longitudinal strain is -18.7 %. The global longitudinal  strain is normal.   2. Right ventricular systolic function is normal. The right ventricular  size is normal. There is normal pulmonary artery systolic pressure. The  estimated right ventricular systolic pressure is 18.7 mmHg.   3. Left atrial size was mildly dilated.   4. The mitral valve is abnormal with prolapse of the posterior leaflet.  Probably moderate to severe (versus severe) eccentric, anteriorly-directed  mitral valve regurgitation. Not fully visualized due to eccentricity. No  evidence of mitral stenosis.  Would consider TEE for closer evaluation of the mitral valve.   5. The aortic valve is tricuspid. Aortic valve regurgitation is not  visualized. No aortic stenosis is present.   6. Aortic dilatation noted. There is mild dilatation of the ascending  aorta, measuring 40 mm.     Neuro/Psych negative neurological ROS  negative psych ROS   GI/Hepatic negative GI ROS, Neg liver ROS,,,  Endo/Other  negative endocrine ROS    Renal/GU negative Renal ROS  negative genitourinary   Musculoskeletal negative musculoskeletal ROS (+)     Abdominal   Peds negative pediatric ROS (+)  Hematology negative hematology ROS (+)   Anesthesia Other Findings   Reproductive/Obstetrics negative OB ROS                             Anesthesia Physical Anesthesia Plan  ASA: 3  Anesthesia Plan: MAC   Post-op Pain Management: Minimal or no pain anticipated   Induction: Intravenous  PONV Risk Score and Plan: 1 and Propofol infusion and Treatment may vary due to age or medical condition  Airway Management Planned: Simple Face Mask  Additional Equipment:   Intra-op Plan:   Post-operative Plan:   Informed Consent: I have reviewed the patients History and Physical, chart, labs and discussed the procedure including the risks, benefits and alternatives for the proposed anesthesia with the patient or authorized representative who has indicated his/her understanding and acceptance.     Dental advisory given  Plan Discussed with: CRNA and Surgeon  Anesthesia Plan Comments:        Anesthesia Quick Evaluation

## 2023-09-30 NOTE — Transfer of Care (Signed)
Immediate Anesthesia Transfer of Care Note  Patient: Larry Stephenson  Procedure(s) Performed: TRANSESOPHAGEAL ECHOCARDIOGRAM  Patient Location: Cath Lab  Anesthesia Type:MAC  Level of Consciousness: drowsy and patient cooperative  Airway & Oxygen Therapy: Patient Spontanous Breathing  Post-op Assessment: Report given to RN, Post -op Vital signs reviewed and stable, and Patient moving all extremities X 4  Post vital signs: Reviewed and stable  Last Vitals:  Vitals Value Taken Time  BP 98/71   Temp    Pulse 68   Resp 16   SpO2 96     Last Pain:  Vitals:   09/30/23 1017  TempSrc:   PainSc: 0-No pain         Complications: No notable events documented.

## 2023-09-30 NOTE — Interval H&P Note (Signed)
History and Physical Interval Note:  09/30/2023 10:41 AM  Larry Stephenson  has presented today for surgery, with the diagnosis of SEVERE MITRAL REGURGITATION.  The various methods of treatment have been discussed with the patient and family. After consideration of risks, benefits and other options for treatment, the patient has consented to  Procedure(s): TRANSESOPHAGEAL ECHOCARDIOGRAM (N/A) as a surgical intervention.  The patient's history has been reviewed, patient examined, no change in status, stable for surgery.  I have reviewed the patient's chart and labs.  Questions were answered to the patient's satisfaction.     Little Ishikawa

## 2023-09-30 NOTE — Anesthesia Postprocedure Evaluation (Signed)
Anesthesia Post Note  Patient: Larry Stephenson  Procedure(s) Performed: TRANSESOPHAGEAL ECHOCARDIOGRAM     Patient location during evaluation: PACU Anesthesia Type: MAC Level of consciousness: awake and alert Pain management: pain level controlled Vital Signs Assessment: post-procedure vital signs reviewed and stable Respiratory status: spontaneous breathing, nonlabored ventilation, respiratory function stable and patient connected to nasal cannula oxygen Cardiovascular status: stable and blood pressure returned to baseline Postop Assessment: no apparent nausea or vomiting Anesthetic complications: no  No notable events documented.  Last Vitals:  Vitals:   09/30/23 1017 09/30/23 1138  BP:    Pulse:    Resp: (!) 53   Temp:  36.9 C  SpO2:      Last Pain:  Vitals:   09/30/23 1138  TempSrc: Temporal  PainSc: 0-No pain                 Olumide Dolinger S

## 2023-10-03 ENCOUNTER — Encounter: Payer: Self-pay | Admitting: Cardiology

## 2023-10-03 DIAGNOSIS — I340 Nonrheumatic mitral (valve) insufficiency: Secondary | ICD-10-CM

## 2023-10-14 ENCOUNTER — Other Ambulatory Visit: Payer: Self-pay | Admitting: Cardiology

## 2023-10-14 ENCOUNTER — Ambulatory Visit (HOSPITAL_COMMUNITY)
Admission: RE | Admit: 2023-10-14 | Discharge: 2023-10-14 | Disposition: A | Discharge status: Home / Self Care | Payer: BLUE CROSS/BLUE SHIELD | Source: Ambulatory Visit | Attending: Cardiology | Admitting: Cardiology

## 2023-10-14 DIAGNOSIS — I340 Nonrheumatic mitral (valve) insufficiency: Secondary | ICD-10-CM

## 2023-10-14 MED ORDER — GADOBUTROL 1 MMOL/ML IV SOLN
10.0000 mL | Freq: Once | INTRAVENOUS | Status: AC | PRN
  Administered 2023-10-14: 10 mL via INTRAVENOUS

## 2023-12-10 ENCOUNTER — Encounter: Payer: Self-pay | Admitting: Podiatry

## 2023-12-10 ENCOUNTER — Other Ambulatory Visit (HOSPITAL_COMMUNITY): Payer: BLUE CROSS/BLUE SHIELD

## 2023-12-10 ENCOUNTER — Ambulatory Visit (INDEPENDENT_AMBULATORY_CARE_PROVIDER_SITE_OTHER): Payer: BLUE CROSS/BLUE SHIELD | Admitting: Podiatry

## 2023-12-10 ENCOUNTER — Ambulatory Visit (INDEPENDENT_AMBULATORY_CARE_PROVIDER_SITE_OTHER): Payer: BLUE CROSS/BLUE SHIELD

## 2023-12-10 DIAGNOSIS — D2372 Other benign neoplasm of skin of left lower limb, including hip: Secondary | ICD-10-CM | POA: Diagnosis not present

## 2023-12-10 DIAGNOSIS — M778 Other enthesopathies, not elsewhere classified: Secondary | ICD-10-CM

## 2023-12-10 DIAGNOSIS — M21622 Bunionette of left foot: Secondary | ICD-10-CM

## 2023-12-10 NOTE — Progress Notes (Signed)
Subjective:   Patient ID: Larry Stephenson, male   DOB: 68 y.o.   MRN: [id number]   HPI Chief Complaint  Patient presents with   Foot Pain    RM#11 Patient states having left foot pain around bunion area X 5-6 months now very uncomfortable at times.   46 male presents after the above concerns.  He gets a tender area along the fifth metatarsal head laterally on the left foot.  He is an avid runner.  He has had no treatment other than try to use a pumice stone on the area.  No open lesions or any drainage.  No signs of infection reports.  No other concerns.     Review of Systems  All other systems reviewed and are negative.  Past Medical History:  Diagnosis Date   Abnormal LFTs    Allergic rhinitis    Basal cell carcinoma    Dysplastic nevus    Elevated PSA    Fatty liver    Heart murmur    Lipoma of axilla    Melena    Mitral regurgitation    Osteoarthritis    Positive colorectal cancer screening using Cologuard test    Vitamin B 12 deficiency    Vitamin D deficiency     Past Surgical History:  Procedure Laterality Date   COLONOSCOPY WITH PROPOFOL N/A 03/26/2017   Procedure: COLONOSCOPY WITH PROPOFOL;  Surgeon: Charolett Bumpers, MD;  Location: [facility];  Service: Endoscopy;  Laterality: N/A;  [phone]   TRANSESOPHAGEAL ECHOCARDIOGRAM (CATH LAB) N/A 09/30/2023   Procedure: TRANSESOPHAGEAL ECHOCARDIOGRAM;  Surgeon: Little Ishikawa, MD;  Location: [facility] INVASIVE CV LAB;  Service: Cardiovascular;  Laterality: N/A;     Current Outpatient Medications:    Cholecalciferol (VITAMIN D) 2000 units CAPS, Take 2,000 Units by mouth daily., Disp: , Rfl:    Cyanocobalamin (VITAMIN B-12 PO), Take 2,500 mcg by mouth daily., Disp: , Rfl:   Allergies  Allergen Reactions   Other     Other reaction(s): Other (See Comments)   Seasonal Ic [Cholestatin]            Objective:  Physical Exam  General: AAO x3, NAD  Dermatological: Hyperkeratotic lesion of the left fifth  metatarsal head without any underlying ulceration, drainage or any signs of infection noted today.  There are no open lesions identified otherwise.  Vascular: Dorsalis Pedis artery and Posterior Tibial artery pedal pulses are 2/4 bilateral with immedate capillary fill time. There is no pain with calf compression, swelling, warmth, erythema.   Neruologic: Grossly intact via light touch bilateral.   Musculoskeletal: The Fifth Metatarsal Head Laterally.  There Is No Other Areas of Discomfort Noted Today.     Assessment:   Hyperkeratotic lesion, mild tailor's bunion left foot     Plan:  -Treatment options discussed including all alternatives, risks, and complications -Etiology of symptoms were discussed -X-rays obtained reviewed.  Multiple views obtained.  No subacute fracture.  Mild prominence along the fifth metatarsal head laterally. -Patient to debride the hyperkeratotic lesion without any complications or bleeding.  I cleaned the skin with alcohol.  Salinocaine was applied followed by occlusive bandage.  Postprocedure instructions discussed.  Monitoring signs or symptoms of infection. -He has inserts inside of his shoes.  I had Nicki Guadalajara, pedorthist evaluate the patient's orthotics to see about any modifications to help decrease pressure to the lateral aspect of the foot. -Dispensed offloading pads.  No follow-ups on file.  Vivi Barrack DPM

## 2023-12-10 NOTE — Patient Instructions (Signed)
Keep the bandage on for 24 hours. At that time, remove and clean with soap and water. If it hurts or burns before 24 hours go ahead and remove the bandage and wash with soap and water. Keep the area clean. If there is any blistering cover with antibiotic ointment and a bandage. Monitor for any redness, drainage, or other signs of infection. Call the office if any are to occur. If you have any questions, please call the office at [phone].

## 2024-05-06 ENCOUNTER — Encounter: Payer: Self-pay | Admitting: Cardiology

## 2024-05-29 ENCOUNTER — Telehealth: Payer: Self-pay

## 2024-05-29 NOTE — Telephone Encounter (Signed)
   Pre-operative Risk Assessment    Patient Name: Larry Stephenson  DOB: 05/11/1956 MRN: [id number]   Date of last office visit: 09/30/23 Date of next office visit: 07/22/24   Request for Surgical Clearance    Procedure:  Colonoscopy/Endoscopy  Date of Surgery:  Clearance 07/08/24                                Surgeon:  Dr. Elsie Cree Surgeon's Group or Practice Name:  [facility] Phone number:  [phone] Fax number:  [phone]   Type of Clearance Requested:   - Medical    Type of Anesthesia:  Propofol    Additional requests/questions:    Bonney Ival LOISE Gerome   05/29/2024, 1:02 PM

## 2024-05-29 NOTE — Telephone Encounter (Signed)
   Name: Larry Stephenson  DOB: 1955-12-20  MRN: [id number]  Primary Cardiologist: Oneil Parchment, MD  Chart reviewed as part of pre-operative protocol coverage. Because Larry Stephenson lives in [place] , we are unable to complete virtual visit and he will require a follow-up in-office visit in order to better assess preoperative cardiovascular risk.  Pre-op covering staff: - Please schedule appointment and call patient to inform them. If patient already had an upcoming appointment within acceptable timeframe, please add pre-op clearance to the appointment notes so provider is aware. - Please contact requesting surgeon's office via preferred method (i.e, phone, fax) to inform them of need for appointment prior to surgery.   Artist Pouch, PA-C  05/29/2024, 1:14 PM

## 2024-06-01 NOTE — Telephone Encounter (Signed)
 Pt has been scheduled to see [name], [facility] 06/16/24 @ 8:45 for preop clearance. Pt would like to keep appt with Dr. Jeffrie 07/22/24 as his regular f/u appt.    I will update all parties involved of appt.

## 2024-06-10 NOTE — Progress Notes (Signed)
 Cardiology Office Note:  .   Date:  06/16/2024  ID:  Larry Stephenson, DOB 09-05-1956, MRN [id number] PCP: Freddrick Johns  [facility] Providers Cardiologist:  Oneil Parchment, MD    History of Present Illness: .   Larry Stephenson is a 68 y.o. male with HTN, mod-severe MR on echo and TEE 09/2023.Moderate on MRI 10/2023.  Despite this remains highly active. Watch has detected Afib but monitor did not.  Apple watch detected and elevated HR 188/m May 13 for 30 min while on a morning walk and felt SOB.  He's here for preop clearance 8//27/25 for colonoscopy/endoscopy by Dr. Elsie Cree. Patient denies any further incidences of fast HR's since May. No chest pain, palpitations, dizziness, edema. Walks 2-3 miles daily. [place] and home gym.  ROS:    Studies Reviewed: SABRA    EKG Interpretation Date/Time:  Tuesday June 16 2024 08:48:10 EDT Ventricular Rate:  58 PR Interval:  182 QRS Duration:  96 QT Interval:  420 QTC Calculation: 412 R Axis:   -1  Text Interpretation: Sinus bradycardia When compared with ECG of 23-Sep-2023 09:18, No significant change was found Confirmed by Larry Stephenson [phone]) on 06/16/2024 8:52:40 AM    Prior CV Studies:   TEE 09/2023 FINDINGS:   Flail P2.  Moderate to severe MR.  Cardiac MRI 10/2023   IMPRESSION: 1. Abnormal mitral valve with prolapse of the posterior leaflet and anteriorly directed MR. Regurgitant volume 17 cc Regurgitant fraction 14% Note review of patients TEE shows flail P2 segment and? History of PAF. Patient will f/u with Dr Parchment to discuss further need for surgical Rx   2.  Mild LVE with normal EF 55%   3.  Normal RV size and function RVEF 52%   4.  Mild bi atrial enlargement   5.  Normal parametric measures see values above   6.  No delayed gadolinium uptake on inversion recovery sequences   Larry Stephenson     Electronically Signed   By: Larry Stephenson M.D.   On: 10/14/2023  13:00    FINDINGS: Mild bi atrial enlargement. No ASD/PFO. No pericardial effusion. Normal ascending thoracic aorta. 2.7 cm. AV, TV and PV normal. The MV is abnormal there is prolapse of the posterior leaflet with anteriorly directed MR.   Quantitative LVEF 55% (EDV 216 ESV 97 SV 119 cc )   Quantitative RVEF 52% (EDV 212 cc ESV 102 cc SV 110 cc )   Total forward volume through the AV plane 103 cc with CO 5.3 L/min at HR 51 bpm   Regurgitant Volume equals 119-102 cc = 17 cc with Regurgitant fraction of 14%   Delayed enhancement images with gadolinium show no scar, infarct or infiltration   Parametric Measures: Using Hct 42   T1: Normal 1018 msec   ECV: Normal 26%   T2 Normal 45.3 msec   IMPRESSION: 1. Estimated cardiac output 5.3 L/min   2.  Estimated mitral valve regurgitant volume 17 cc with RF 14%   Larry Stephenson    Risk Assessment/Calculations:             Physical Exam:   VS:  BP 126/80   Pulse 69   Ht 6' 1 (1.854 m)   Wt 179 lb 6.4 oz (81.4 kg)   SpO2 97%   BMI 23.67 kg/m    Orhtostatics: No data found. Wt Readings from Last 3 Encounters:  06/16/24 179 lb 6.4 oz (81.4  kg)  09/30/23 174 lb (78.9 kg)  09/23/23 179 lb (81.2 kg)    GEN: Well nourished, well developed in no acute distress NECK: No JVD; No carotid bruits CARDIAC:  RRR, no murmurs, rubs, gallops RESPIRATORY:  Clear to auscultation without rales, wheezing or rhonchi  ABDOMEN: Soft, non-tender, non-distended EXTREMITIES:  No edema; No deformity   ASSESSMENT AND PLAN: .    Preop clearance 8//27/25 for colonoscopy/endoscopy by Dr. Elsie Cree. Patient with history of mod to severe MR but very active. Reasonable risk to proceed with colonoscopy.  According to the Revised Cardiac Risk Index (RCRI), his Perioperative Risk of Major Cardiac Event is (%): 0.4  His Functional Capacity in METs is: 9.16 according to the Duke Activity Status Index (DASI).   Moderate to severe MR, moderate on  MRI, noral LVEF. With lack of symptoms at the time f/u in 6 months recommended. Will try to arrange echo same day as appt with Dr. Jeffrie since he's traveling from [place].  Arrhythmia detected on apple watch with recent high HR 188/m for 30 min while walking in May-none since. Not sure if he has EKG monitoring on apple watch but he will look into.            Dispo: keep f/u  Signed, [name], PA-C

## 2024-06-16 ENCOUNTER — Ambulatory Visit: Attending: Cardiovascular Disease | Admitting: Physician Assistant

## 2024-06-16 ENCOUNTER — Encounter: Payer: Self-pay | Admitting: Physician Assistant

## 2024-06-16 VITALS — BP 126/80 | HR 69 | Ht 73.0 in | Wt 179.4 lb

## 2024-06-16 DIAGNOSIS — Z01810 Encounter for preprocedural cardiovascular examination: Secondary | ICD-10-CM

## 2024-06-16 DIAGNOSIS — I340 Nonrheumatic mitral (valve) insufficiency: Secondary | ICD-10-CM

## 2024-06-16 DIAGNOSIS — R002 Palpitations: Secondary | ICD-10-CM | POA: Diagnosis not present

## 2024-06-16 NOTE — Patient Instructions (Signed)
 Medication Instructions:  No changes *If you need a refill on your cardiac medications before your next appointment, please call your pharmacy*  Lab Work: No labs If you have labs (blood work) drawn today and your tests are completely normal, you will receive your results only by: MyChart Message (if you have MyChart) OR A paper copy in the mail If you have any lab test that is abnormal or we need to change your treatment, we will call you to review the results.  Testing/Procedures: Echo will need to be scheduled for an hour prior to appointment on September 10th Your physician has requested that you have an echocardiogram. Echocardiography is a painless test that uses sound waves to create images of your heart. It provides your doctor with information about the size and shape of your heart and how well your heart's chambers and valves are working. This procedure takes approximately one hour. There are no restrictions for this procedure. Please do NOT wear cologne, perfume, aftershave, or lotions (deodorant is allowed). Please arrive 15 minutes prior to your appointment time.  Please note: We ask at that you not bring children with you during ultrasound (echo/ vascular) testing. Due to room size and safety concerns, children are not allowed in the ultrasound rooms during exams. Our front office staff cannot provide observation of children in our lobby area while testing is being conducted. An adult accompanying a patient to their appointment will only be allowed in the ultrasound room at the discretion of the ultrasound technician under special circumstances. We apologize for any inconvenience.   Follow-Up: At [facility], you and your health needs are our priority.  As part of our continuing mission to provide you with exceptional heart care, our providers are all part of one team.  This team includes your primary Cardiologist (physician) and Advanced Practice Providers or APPs  (Physician Assistants and Nurse Practitioners) who all work together to provide you with the care you need, when you need it.  Your next appointment:   Keep f/u   We recommend signing up for the patient portal called MyChart.  Sign up information is provided on this After Visit Summary.  MyChart is used to connect with patients for Virtual Visits (Telemedicine).  Patients are able to view lab/test results, encounter notes, upcoming appointments, etc.  Non-urgent messages can be sent to your provider as well.   To learn more about what you can do with MyChart, go to [email or web address].

## 2024-06-22 NOTE — Telephone Encounter (Signed)
 [facility] is calling for clarification for in regard to the patient's clearance. Please advise.

## 2024-06-22 NOTE — Telephone Encounter (Signed)
 Clearance was faxed over to the requesting office, will remove from our pool.

## 2024-07-22 ENCOUNTER — Ambulatory Visit (HOSPITAL_COMMUNITY)
Admission: RE | Admit: 2024-07-22 | Discharge: 2024-07-22 | Disposition: A | Discharge status: Home / Self Care | Source: Ambulatory Visit | Attending: Cardiovascular Disease | Admitting: Cardiovascular Disease

## 2024-07-22 ENCOUNTER — Ambulatory Visit: Attending: Cardiology | Admitting: Cardiology

## 2024-07-22 ENCOUNTER — Encounter: Payer: Self-pay | Admitting: Cardiology

## 2024-07-22 VITALS — BP 112/66 | HR 52 | Resp 16 | Ht 73.0 in | Wt 179.6 lb

## 2024-07-22 DIAGNOSIS — I340 Nonrheumatic mitral (valve) insufficiency: Secondary | ICD-10-CM

## 2024-07-22 DIAGNOSIS — Z01810 Encounter for preprocedural cardiovascular examination: Secondary | ICD-10-CM | POA: Diagnosis present

## 2024-07-22 DIAGNOSIS — R002 Palpitations: Secondary | ICD-10-CM | POA: Insufficient documentation

## 2024-07-22 LAB — ECHOCARDIOGRAM COMPLETE
Area-P 1/2: 3.5 cm2
MV M vel: 4.74 m/s
MV Peak grad: 89.9 mmHg
Radius: 0.6 cm
S' Lateral: 3.1 cm

## 2024-07-22 NOTE — Patient Instructions (Signed)
 Medication Instructions:  The current medical regimen is effective;  continue present plan and medications.  *If you need a refill on your cardiac medications before your next appointment, please call your pharmacy*  Testing/Procedures: Your physician has requested that you have an echocardiogram in 1 year. Echocardiography is a painless test that uses sound waves to create images of your heart. It provides your doctor with information about the size and shape of your heart and how well your heart's chambers and valves are working. This procedure takes approximately one hour. There are no restrictions for this procedure. Please do NOT wear cologne, perfume, aftershave, or lotions (deodorant is allowed). Please arrive 15 minutes prior to your appointment time.  Please note: We ask at that you not bring children with you during ultrasound (echo/ vascular) testing. Due to room size and safety concerns, children are not allowed in the ultrasound rooms during exams. Our front office staff cannot provide observation of children in our lobby area while testing is being conducted. An adult accompanying a patient to their appointment will only be allowed in the ultrasound room at the discretion of the ultrasound technician under special circumstances. We apologize for any inconvenience.   Follow-Up: At [facility], you and your health needs are our priority.  As part of our continuing mission to provide you with exceptional heart care, our providers are all part of one team.  This team includes your primary Cardiologist (physician) and Advanced Practice Providers or APPs (Physician Assistants and Nurse Practitioners) who all work together to provide you with the care you need, when you need it.  Your next appointment:   1 year(s)  Provider:   Oneil Parchment, MD    We recommend signing up for the patient portal called MyChart.  Sign up information is provided on this After Visit Summary.   MyChart is used to connect with patients for Virtual Visits (Telemedicine).  Patients are able to view lab/test results, encounter notes, upcoming appointments, etc.  Non-urgent messages can be sent to your provider as well.   To learn more about what you can do with MyChart, go to [email or web address].

## 2024-07-22 NOTE — Progress Notes (Signed)
 Cardiology Office Note:  .   Date:  07/22/2024  ID:  [name], DOB [birth year], MRN [id number] PCP: Pcp, No  [facility] Providers Cardiologist:  Oneil Parchment, MD     History of Present Illness: .   Larry Stephenson is a 68 y.o. male Discussed the use of AI scribe software for clinical note transcription with the patient, who gave verbal consent to proceed.  History of Present Illness Larry Stephenson is a 68 year old male with severe mitral valve regurgitation who presents for follow-up.  He has a history of severe mitral valve regurgitation, initially noted as moderate to severe on a transesophageal echocardiogram (TEE) performed on September 30, 2023, which identified a flail P2 leaflet. A subsequent cardiac MRI in December 2024 showed mitral valve prolapse with anteriorly directed mitral regurgitation and a regurgitant volume of 17 ccs. His left ventricular ejection fraction was normal at 65% on the most recent echocardiogram, which also showed a PISA radius of 0.6 cm, ERO of 0.15 cm, and MR volume of 26 mL.  He has been recently diagnosed with iron deficiency anemia and is under the care of another physician. He underwent a gastrointestinal workup, including endoscopy and colonoscopy, which were clear. He is currently taking iron supplements along with vitamins B and D. He experiences shortness of breath, which he initially attributed to his cardiac condition, but now considers may be related to the anemia.  He mentions an episode of atrial fibrillation detected by his Apple Watch in May 2025, but subsequent monitoring did not confirm persistent atrial fibrillation. [place] and landscaping, which he enjoys with no significant shortness of breath.      Studies Reviewed: .        Results LABS Hemoglobin: 12.0 (June 2025)  RADIOLOGY Cardiac MRI: Mitral valve prolapse, posterior leaflet,  anteriorly directed mitral regurgitation with regurgitant volume of 17 cc and regurgitant fraction of 14% (December 2024)  DIAGNOSTIC Transesophageal echocardiogram: Flail P2 with moderate to severe mitral regurgitation (09/30/2023) Echocardiogram: PISA radius of 0.6 cm, EROA of 0.15 cm, mitral regurgitation volume of 26 ml, ejection fraction 65% (07/22/2024) Risk Assessment/Calculations:            Physical Exam:   VS:  BP 112/66 (BP Location: Left Arm, Patient Position: Sitting, Cuff Size: Normal)   Pulse (!) 52   Resp 16   Ht 6' 1 (1.854 m)   Wt 179 lb 9.6 oz (81.5 kg)   SpO2 96%   BMI 23.70 kg/m    Wt Readings from Last 3 Encounters:  07/22/24 179 lb 9.6 oz (81.5 kg)  06/16/24 179 lb 6.4 oz (81.4 kg)  09/30/23 174 lb (78.9 kg)    GEN: Well nourished, well developed in no acute distress NECK: No JVD; No carotid bruits CARDIAC: RRR, no murmurs, no rubs, no gallops RESPIRATORY:  Clear to auscultation without rales, wheezing or rhonchi  ABDOMEN: Soft, non-tender, non-distended EXTREMITIES:  No edema; No deformity   ASSESSMENT AND PLAN: .    Assessment and Plan Assessment & Plan Mitral valve regurgitation Mitral valve regurgitation is well-managed. Recent echocardiogram shows mild to moderate regurgitation, while previous transesophageal echocardiogram indicated moderate to severe regurgitation. Cardiac MRI 10/2023 and current echocardiogram suggest moderate regurgitation. The regurgitant jet is eccentric, wrapping around the atrium, but the heart is stable with normal ejection fraction. No immediate intervention is required as there are no symptoms causing undue harm. -  Schedule echocardiogram in one year to monitor mitral valve regurgitation unless symptoms escalate.  Palpitations/racing heart rate Atrial fibrillation episode detected by Apple Watch, but subsequent monitoring showed no atrial fibrillation. No current symptoms or frequent episodes reported. - Monitor for any  recurrent episodes of atrial fibrillation and report if he occurs.  Iron deficiency anemia - Being worked up by PCP.  Hemoglobin 12.0.  Colonoscopy normal.  Checking further blood work.  Taking iron supplement       Dispo: 1 yr with ECHO same day due to living in [place]  Signed, Oneil Parchment, MD

## 2024-07-27 ENCOUNTER — Ambulatory Visit: Payer: Self-pay | Admitting: Cardiology

## 2024-07-27 DIAGNOSIS — I340 Nonrheumatic mitral (valve) insufficiency: Secondary | ICD-10-CM
